# Patient Record
Sex: Female | Born: 1935 | Race: Black or African American | Hispanic: No | Marital: Single | Smoking: Never smoker
Health system: Southern US, Community
[De-identification: ages and names within clinical notes are randomized; demographics above are authoritative.]

## PROBLEM LIST (undated history)

## (undated) DIAGNOSIS — E119 Type 2 diabetes mellitus without complications: Secondary | ICD-10-CM

## (undated) DIAGNOSIS — M109 Gout, unspecified: Secondary | ICD-10-CM

## (undated) DIAGNOSIS — I10 Essential (primary) hypertension: Secondary | ICD-10-CM

## (undated) DIAGNOSIS — F329 Major depressive disorder, single episode, unspecified: Secondary | ICD-10-CM

## (undated) DIAGNOSIS — F32A Depression, unspecified: Secondary | ICD-10-CM

## (undated) DIAGNOSIS — I639 Cerebral infarction, unspecified: Secondary | ICD-10-CM

## (undated) DIAGNOSIS — I509 Heart failure, unspecified: Secondary | ICD-10-CM

## (undated) DIAGNOSIS — I213 ST elevation (STEMI) myocardial infarction of unspecified site: Secondary | ICD-10-CM

## (undated) DIAGNOSIS — Z930 Tracheostomy status: Secondary | ICD-10-CM

## (undated) DIAGNOSIS — N319 Neuromuscular dysfunction of bladder, unspecified: Secondary | ICD-10-CM

## (undated) DIAGNOSIS — D649 Anemia, unspecified: Secondary | ICD-10-CM

## (undated) DIAGNOSIS — R1310 Dysphagia, unspecified: Secondary | ICD-10-CM

## (undated) DIAGNOSIS — A0472 Enterocolitis due to Clostridium difficile, not specified as recurrent: Secondary | ICD-10-CM

## (undated) HISTORY — PX: PEG PLACEMENT: SHX5437

## (undated) HISTORY — PX: TRACHEOSTOMY: SUR1362

---

## 2014-01-17 ENCOUNTER — Emergency Department (HOSPITAL_COMMUNITY): Payer: Medicare Other

## 2014-01-17 ENCOUNTER — Inpatient Hospital Stay (HOSPITAL_COMMUNITY)
Admission: EM | Admit: 2014-01-17 | Discharge: 2014-01-25 | DRG: 870 | Disposition: A | Discharge status: Skilled Nursing Facility | Payer: Medicare Other | Attending: Internal Medicine | Admitting: Internal Medicine

## 2014-01-17 ENCOUNTER — Encounter (HOSPITAL_COMMUNITY): Payer: Self-pay | Admitting: Emergency Medicine

## 2014-01-17 DIAGNOSIS — N189 Chronic kidney disease, unspecified: Secondary | ICD-10-CM

## 2014-01-17 DIAGNOSIS — Z79899 Other long term (current) drug therapy: Secondary | ICD-10-CM

## 2014-01-17 DIAGNOSIS — J9620 Acute and chronic respiratory failure, unspecified whether with hypoxia or hypercapnia: Secondary | ICD-10-CM

## 2014-01-17 DIAGNOSIS — D649 Anemia, unspecified: Secondary | ICD-10-CM

## 2014-01-17 DIAGNOSIS — A419 Sepsis, unspecified organism: Secondary | ICD-10-CM | POA: Diagnosis present

## 2014-01-17 DIAGNOSIS — E119 Type 2 diabetes mellitus without complications: Secondary | ICD-10-CM | POA: Diagnosis not present

## 2014-01-17 DIAGNOSIS — E440 Moderate protein-calorie malnutrition: Secondary | ICD-10-CM | POA: Diagnosis not present

## 2014-01-17 DIAGNOSIS — I509 Heart failure, unspecified: Secondary | ICD-10-CM | POA: Diagnosis present

## 2014-01-17 DIAGNOSIS — Z9911 Dependence on respirator [ventilator] status: Secondary | ICD-10-CM

## 2014-01-17 DIAGNOSIS — K9420 Gastrostomy complication, unspecified: Secondary | ICD-10-CM

## 2014-01-17 DIAGNOSIS — E876 Hypokalemia: Secondary | ICD-10-CM | POA: Diagnosis not present

## 2014-01-17 DIAGNOSIS — H05019 Cellulitis of unspecified orbit: Secondary | ICD-10-CM

## 2014-01-17 DIAGNOSIS — Z794 Long term (current) use of insulin: Secondary | ICD-10-CM

## 2014-01-17 DIAGNOSIS — L89309 Pressure ulcer of unspecified buttock, unspecified stage: Secondary | ICD-10-CM | POA: Diagnosis present

## 2014-01-17 DIAGNOSIS — J9612 Chronic respiratory failure with hypercapnia: Secondary | ICD-10-CM

## 2014-01-17 DIAGNOSIS — N319 Neuromuscular dysfunction of bladder, unspecified: Secondary | ICD-10-CM | POA: Diagnosis present

## 2014-01-17 DIAGNOSIS — E41 Nutritional marasmus: Secondary | ICD-10-CM | POA: Diagnosis present

## 2014-01-17 DIAGNOSIS — Z87440 Personal history of urinary (tract) infections: Secondary | ICD-10-CM | POA: Diagnosis not present

## 2014-01-17 DIAGNOSIS — R6520 Severe sepsis without septic shock: Secondary | ICD-10-CM | POA: Diagnosis present

## 2014-01-17 DIAGNOSIS — Z515 Encounter for palliative care: Secondary | ICD-10-CM

## 2014-01-17 DIAGNOSIS — D638 Anemia in other chronic diseases classified elsewhere: Secondary | ICD-10-CM | POA: Diagnosis not present

## 2014-01-17 DIAGNOSIS — K9422 Gastrostomy infection: Secondary | ICD-10-CM | POA: Diagnosis present

## 2014-01-17 DIAGNOSIS — L8992 Pressure ulcer of unspecified site, stage 2: Secondary | ICD-10-CM | POA: Diagnosis not present

## 2014-01-17 DIAGNOSIS — Z951 Presence of aortocoronary bypass graft: Secondary | ICD-10-CM

## 2014-01-17 DIAGNOSIS — Y833 Surgical operation with formation of external stoma as the cause of abnormal reaction of the patient, or of later complication, without mention of misadventure at the time of the procedure: Secondary | ICD-10-CM | POA: Diagnosis not present

## 2014-01-17 DIAGNOSIS — IMO0002 Reserved for concepts with insufficient information to code with codable children: Secondary | ICD-10-CM

## 2014-01-17 DIAGNOSIS — K9423 Gastrostomy malfunction: Secondary | ICD-10-CM

## 2014-01-17 DIAGNOSIS — Z930 Tracheostomy status: Secondary | ICD-10-CM

## 2014-01-17 DIAGNOSIS — M1710 Unilateral primary osteoarthritis, unspecified knee: Secondary | ICD-10-CM | POA: Diagnosis present

## 2014-01-17 DIAGNOSIS — Z7982 Long term (current) use of aspirin: Secondary | ICD-10-CM | POA: Diagnosis not present

## 2014-01-17 DIAGNOSIS — L03319 Cellulitis of trunk, unspecified: Secondary | ICD-10-CM

## 2014-01-17 DIAGNOSIS — J9622 Acute and chronic respiratory failure with hypercapnia: Secondary | ICD-10-CM

## 2014-01-17 DIAGNOSIS — I252 Old myocardial infarction: Secondary | ICD-10-CM

## 2014-01-17 DIAGNOSIS — N179 Acute kidney failure, unspecified: Secondary | ICD-10-CM | POA: Diagnosis present

## 2014-01-17 DIAGNOSIS — K559 Vascular disorder of intestine, unspecified: Secondary | ICD-10-CM | POA: Diagnosis not present

## 2014-01-17 DIAGNOSIS — Z8673 Personal history of transient ischemic attack (TIA), and cerebral infarction without residual deficits: Secondary | ICD-10-CM

## 2014-01-17 DIAGNOSIS — L02219 Cutaneous abscess of trunk, unspecified: Secondary | ICD-10-CM | POA: Diagnosis present

## 2014-01-17 DIAGNOSIS — E43 Unspecified severe protein-calorie malnutrition: Secondary | ICD-10-CM

## 2014-01-17 DIAGNOSIS — J9621 Acute and chronic respiratory failure with hypoxia: Secondary | ICD-10-CM

## 2014-01-17 DIAGNOSIS — E088 Diabetes mellitus due to underlying condition with unspecified complications: Secondary | ICD-10-CM

## 2014-01-17 DIAGNOSIS — J9611 Chronic respiratory failure with hypoxia: Secondary | ICD-10-CM

## 2014-01-17 DIAGNOSIS — J9610 Chronic respiratory failure, unspecified whether with hypoxia or hypercapnia: Secondary | ICD-10-CM | POA: Diagnosis not present

## 2014-01-17 DIAGNOSIS — I129 Hypertensive chronic kidney disease with stage 1 through stage 4 chronic kidney disease, or unspecified chronic kidney disease: Secondary | ICD-10-CM | POA: Diagnosis present

## 2014-01-17 DIAGNOSIS — R0902 Hypoxemia: Secondary | ICD-10-CM

## 2014-01-17 HISTORY — DX: Depression, unspecified: F32.A

## 2014-01-17 HISTORY — DX: Dysphagia, unspecified: R13.10

## 2014-01-17 HISTORY — DX: Major depressive disorder, single episode, unspecified: F32.9

## 2014-01-17 HISTORY — DX: Heart failure, unspecified: I50.9

## 2014-01-17 HISTORY — DX: Type 2 diabetes mellitus without complications: E11.9

## 2014-01-17 HISTORY — DX: Cerebral infarction, unspecified: I63.9

## 2014-01-17 HISTORY — DX: Enterocolitis due to Clostridium difficile, not specified as recurrent: A04.72

## 2014-01-17 HISTORY — DX: Tracheostomy status: Z93.0

## 2014-01-17 HISTORY — DX: Essential (primary) hypertension: I10

## 2014-01-17 HISTORY — DX: Anemia, unspecified: D64.9

## 2014-01-17 HISTORY — DX: ST elevation (STEMI) myocardial infarction of unspecified site: I21.3

## 2014-01-17 HISTORY — DX: Gout, unspecified: M10.9

## 2014-01-17 HISTORY — DX: Neuromuscular dysfunction of bladder, unspecified: N31.9

## 2014-01-17 LAB — CK: Total CK: 32 U/L (ref 7–177)

## 2014-01-17 LAB — CBC WITH DIFFERENTIAL/PLATELET
BASOS PCT: 0 % (ref 0–1)
Basophils Absolute: 0.1 10*3/uL (ref 0.0–0.1)
Eosinophils Absolute: 0.2 10*3/uL (ref 0.0–0.7)
Eosinophils Relative: 1 % (ref 0–5)
HEMATOCRIT: 26.2 % — AB (ref 36.0–46.0)
HEMOGLOBIN: 8.6 g/dL — AB (ref 12.0–15.0)
Lymphocytes Relative: 7 % — ABNORMAL LOW (ref 12–46)
Lymphs Abs: 1.7 10*3/uL (ref 0.7–4.0)
MCH: 27.4 pg (ref 26.0–34.0)
MCHC: 32.8 g/dL (ref 30.0–36.0)
MCV: 83.4 fL (ref 78.0–100.0)
MONO ABS: 1 10*3/uL (ref 0.1–1.0)
Monocytes Relative: 4 % (ref 3–12)
Neutro Abs: 21.4 10*3/uL — ABNORMAL HIGH (ref 1.7–7.7)
Neutrophils Relative %: 88 % — ABNORMAL HIGH (ref 43–77)
Platelets: 346 10*3/uL (ref 150–400)
RBC: 3.14 MIL/uL — ABNORMAL LOW (ref 3.87–5.11)
RDW: 16.9 % — ABNORMAL HIGH (ref 11.5–15.5)
WBC: 24.4 10*3/uL — ABNORMAL HIGH (ref 4.0–10.5)

## 2014-01-17 LAB — I-STAT CHEM 8, ED
BUN: 63 mg/dL — ABNORMAL HIGH (ref 6–23)
CALCIUM ION: 1.05 mmol/L — AB (ref 1.13–1.30)
CREATININE: 2.6 mg/dL — AB (ref 0.50–1.10)
Chloride: 98 mEq/L (ref 96–112)
Glucose, Bld: 146 mg/dL — ABNORMAL HIGH (ref 70–99)
HEMATOCRIT: 27 % — AB (ref 36.0–46.0)
HEMOGLOBIN: 9.2 g/dL — AB (ref 12.0–15.0)
Potassium: 2.9 mEq/L — CL (ref 3.7–5.3)
Sodium: 134 mEq/L — ABNORMAL LOW (ref 137–147)
TCO2: 26 mmol/L (ref 0–100)

## 2014-01-17 LAB — MAGNESIUM: Magnesium: 2.5 mg/dL (ref 1.5–2.5)

## 2014-01-17 LAB — TROPONIN I

## 2014-01-17 LAB — URINALYSIS, ROUTINE W REFLEX MICROSCOPIC
BILIRUBIN URINE: NEGATIVE
GLUCOSE, UA: NEGATIVE mg/dL
KETONES UR: NEGATIVE mg/dL
Nitrite: NEGATIVE
PH: 5 (ref 5.0–8.0)
Protein, ur: 100 mg/dL — AB
Specific Gravity, Urine: 1.018 (ref 1.005–1.030)
Urobilinogen, UA: 0.2 mg/dL (ref 0.0–1.0)

## 2014-01-17 LAB — I-STAT CG4 LACTIC ACID, ED: Lactic Acid, Venous: 1.39 mmol/L (ref 0.5–2.2)

## 2014-01-17 LAB — URINE MICROSCOPIC-ADD ON

## 2014-01-17 LAB — CBG MONITORING, ED: Glucose-Capillary: 151 mg/dL — ABNORMAL HIGH (ref 70–99)

## 2014-01-17 MED ORDER — INSULIN ASPART 100 UNIT/ML ~~LOC~~ SOLN
0.0000 [IU] | SUBCUTANEOUS | Status: DC
  Administered 2014-01-18: 1 [IU] via SUBCUTANEOUS
  Administered 2014-01-18: 2 [IU] via SUBCUTANEOUS
  Administered 2014-01-18 (×2): 1 [IU] via SUBCUTANEOUS
  Administered 2014-01-19 (×2): 3 [IU] via SUBCUTANEOUS
  Administered 2014-01-19 (×2): 2 [IU] via SUBCUTANEOUS
  Administered 2014-01-19 (×2): 3 [IU] via SUBCUTANEOUS
  Administered 2014-01-20 (×4): 1 [IU] via SUBCUTANEOUS
  Administered 2014-01-20 – 2014-01-21 (×3): 2 [IU] via SUBCUTANEOUS
  Administered 2014-01-21: 3 [IU] via SUBCUTANEOUS
  Administered 2014-01-21 – 2014-01-22 (×6): 2 [IU] via SUBCUTANEOUS

## 2014-01-17 MED ORDER — FUROSEMIDE 10 MG/ML IJ SOLN
40.0000 mg | Freq: Every day | INTRAMUSCULAR | Status: DC
  Administered 2014-01-18 – 2014-01-25 (×8): 40 mg via INTRAVENOUS
  Filled 2014-01-17 (×9): qty 4

## 2014-01-17 MED ORDER — SODIUM CHLORIDE 0.9 % IV SOLN
250.0000 mg | Freq: Two times a day (BID) | INTRAVENOUS | Status: DC
  Administered 2014-01-17 – 2014-01-24 (×14): 250 mg via INTRAVENOUS
  Filled 2014-01-17 (×15): qty 250

## 2014-01-17 MED ORDER — HYDRALAZINE HCL 25 MG PO TABS: 25.0000 mg | ORAL_TABLET | Freq: Two times a day (BID) | ORAL | Status: DC

## 2014-01-17 MED ORDER — POTASSIUM CHLORIDE 10 MEQ/100ML IV SOLN: 10.0000 meq | INTRAVENOUS | Status: AC

## 2014-01-17 MED ORDER — DIVALPROEX SODIUM 125 MG PO CPSP: 125.0000 mg | ORAL_CAPSULE | Freq: Two times a day (BID) | ORAL | Status: DC

## 2014-01-17 MED ORDER — METOPROLOL TARTRATE 1 MG/ML IV SOLN
5.0000 mg | Freq: Four times a day (QID) | INTRAVENOUS | Status: DC
  Administered 2014-01-18 – 2014-01-25 (×29): 5 mg via INTRAVENOUS
  Filled 2014-01-17 (×34): qty 5

## 2014-01-17 MED ORDER — DEXTROSE 5 % IV SOLN: 1.0000 g | INTRAVENOUS | Status: DC

## 2014-01-17 MED ORDER — POTASSIUM CHLORIDE 10 MEQ/100ML IV SOLN
10.0000 meq | Freq: Once | INTRAVENOUS | Status: AC
  Administered 2014-01-17: 10 meq via INTRAVENOUS
  Filled 2014-01-17: qty 100

## 2014-01-17 MED ORDER — ALLOPURINOL 100 MG PO TABS: 100.0000 mg | ORAL_TABLET | Freq: Every day | ORAL | Status: DC

## 2014-01-17 MED ORDER — FERROUS SULFATE 300 (60 FE) MG/5ML PO SYRP
300.0000 mg | ORAL_SOLUTION | Freq: Two times a day (BID) | ORAL | Status: DC
Start: 2014-01-18 — End: 2014-01-17

## 2014-01-17 MED ORDER — CHLORHEXIDINE GLUCONATE 0.12 % MT SOLN
15.0000 mL | Freq: Two times a day (BID) | OROMUCOSAL | Status: DC
  Administered 2014-01-17 – 2014-01-25 (×16): 15 mL via OROMUCOSAL
  Filled 2014-01-17 (×17): qty 15

## 2014-01-17 MED ORDER — GLUCAGON HCL (RDNA) 1 MG IJ SOLR: 1.0000 mg | Freq: Once | INTRAMUSCULAR | Status: AC | PRN

## 2014-01-17 MED ORDER — ASPIRIN 81 MG PO CHEW: 81.0000 mg | CHEWABLE_TABLET | Freq: Every day | ORAL | Status: DC

## 2014-01-17 MED ORDER — DEXTROSE 5 % IV SOLN: 2.0000 g | Freq: Once | INTRAVENOUS | Status: DC

## 2014-01-17 MED ORDER — FLUCONAZOLE IN SODIUM CHLORIDE 200-0.9 MG/100ML-% IV SOLN
200.0000 mg | INTRAVENOUS | Status: DC
  Administered 2014-01-18: 200 mg via INTRAVENOUS
  Filled 2014-01-17: qty 100

## 2014-01-17 MED ORDER — INSULIN DETEMIR 100 UNIT/ML ~~LOC~~ SOLN: 12.0000 [IU] | Freq: Two times a day (BID) | SUBCUTANEOUS | Status: DC

## 2014-01-17 MED ORDER — METOPROLOL TARTRATE 25 MG PO TABS: 50.0000 mg | ORAL_TABLET | Freq: Two times a day (BID) | ORAL | Status: DC

## 2014-01-17 MED ORDER — SODIUM BICARBONATE 650 MG PO TABS: 1300.0000 mg | ORAL_TABLET | Freq: Two times a day (BID) | ORAL | Status: DC

## 2014-01-17 MED ORDER — DEXTROSE 50 % IV SOLN: 50.0000 mL | Freq: Once | INTRAVENOUS | Status: AC | PRN

## 2014-01-17 MED ORDER — FUROSEMIDE 20 MG PO TABS: 80.0000 mg | ORAL_TABLET | Freq: Two times a day (BID) | ORAL | Status: DC

## 2014-01-17 MED ORDER — IMIPENEM-CILASTATIN 500 MG IV SOLR: 500.0000 mg | Freq: Three times a day (TID) | INTRAVENOUS | Status: DC

## 2014-01-17 MED ORDER — DEXTROSE 50 % IV SOLN: 25.0000 mL | Freq: Once | INTRAVENOUS | Status: AC | PRN

## 2014-01-17 MED ORDER — FUROSEMIDE 10 MG/ML IJ SOLN
20.0000 mg | Freq: Once | INTRAMUSCULAR | Status: AC
  Administered 2014-01-18: 20 mg via INTRAVENOUS
  Filled 2014-01-17: qty 2

## 2014-01-17 MED ORDER — DEXTROSE-NACL 5-0.45 % IV SOLN
INTRAVENOUS | Status: AC
  Administered 2014-01-18: 03:00:00 via INTRAVENOUS

## 2014-01-17 NOTE — ED Notes (Signed)
CBG 151. 

## 2014-01-17 NOTE — Consult Note (Signed)
Reason for Consult:  PEG tube dysfunction Referring Physician: Dr. Lyda Perone  Tricia Reed is an 80 y.o. female.  HPI: 80 year old AA female who was admitted from [facility] after PEG tube became non-functional.  This is the patient's first admission into the [facility] system.  Reportedly, in May 2015, she was at [facility] and suffered a STEMI.  She underwent emergent CABG and had prolonged intubation requiring tracheostomy.  She was then transferred to [facility] in [place].  While at [facility] for one month, she developed pressure ulcers, acute renal failure, C. Diff colitis, Klebsiella tracheitis.  She was transferred to [facility] 12/01/13. The PEG was placed by Dr. Hardin Negus on 12/28/13.  Apparently, she has developed a lot of drainage and cellulitis around the feeding tube. There have been progress notes in their records from at least 8/24 regarding the feeding tube dysfunction. There are no further notes from Dr. Logan Bores.  They have been treating her with D10 IV for the last week.     She was then transferred to [facility] because of the feeding tube.  She is still a full code  Past Medical History  Diagnosis Date  . Diabetes mellitus without complication   . Tracheostomy dependence   . Dysphagia   . Clostridium difficile diarrhea   . Gout   . Hypertension   . CVA (cerebral infarction)   . CHF (congestive heart failure)   . Anemia   . Depressed   . Neurogenic bladder   . Stroke   . STEMI (ST elevation myocardial infarction)     Past Surgical History  Procedure Laterality Date  . Peg placement    . Tracheostomy      History reviewed. No pertinent family history.  Social History:  reports that she has never smoked. She does not have any smokeless tobacco history on file. She reports that she does not drink alcohol or use illicit drugs.  Allergies: No Known Allergies  Medications:  Prior to Admission medications   Medication Sig Start Date End Date Taking?  Authorizing Provider  allopurinol (ZYLOPRIM) 100 MG tablet Take 100 mg by mouth daily.   Yes Historical Provider, MD  aspirin 81 MG chewable tablet Chew 81 mg by mouth daily.   Yes Historical Provider, MD  chlorhexidine (PERIDEX) 0.12 % solution Use as directed 15 mLs in the mouth or throat 2 (two) times daily.   Yes Historical Provider, MD  DAPTOMYCIN IV Inject 500 mg into the vein daily. Started 01/10/14, for 14 days ending 01/24/14   Yes Historical Provider, MD  divalproex (DEPAKOTE SPRINKLE) 125 MG capsule Take 125 mg by mouth 2 (two) times daily.   Yes Historical Provider, MD  enoxaparin (LOVENOX) 30 MG/0.3ML injection Inject 30 mg into the skin daily. Discontinued 01/12/14 12/03/13  Yes Historical Provider, MD  ferrous sulfate 300 (60 FE) MG/5ML syrup Take 300 mg by mouth 2 (two) times daily with a meal.   Yes Historical Provider, MD  fluconazole (DIFLUCAN) 200-0.9 MG/100ML-% IVPB Inject 200 mg into the vein daily. For 14 days 01/10/14  Yes Historical Provider, MD  furosemide (LASIX) 80 MG tablet Take 80 mg by mouth 2 (two) times daily.   Yes Historical Provider, MD  glucagon (GLUCAGEN HYPOKIT) 1 MG SOLR injection Inject 1 mg into the vein once as needed for low blood sugar.   Yes Historical Provider, MD  hydrALAZINE (APRESOLINE) 25 MG tablet Take 25 mg by mouth 2 (two) times daily.   Yes Historical  Provider, MD  insulin detemir (LEVEMIR) 100 UNIT/ML injection Inject 12 Units into the skin 2 (two) times daily.   Yes Historical Provider, MD  insulin regular (NOVOLIN R,HUMULIN R) 100 units/mL injection Inject 0-5 Units into the skin 3 (three) times daily before meals.   Yes Historical Provider, MD  meropenem (MERREM) 500 MG injection Inject 500 mg into the muscle every 8 (eight) hours. Started 01/12/14, for 14 days ending 01/27/14   Yes Historical Provider, MD  metoprolol (LOPRESSOR) 50 MG tablet Take 50 mg by mouth 2 (two) times daily.   Yes Historical Provider, MD  potassium chloride 40 MEQ/15ML (20%)  LIQD Take 40 mEq by mouth as needed (for potassium 2.8).   Yes Historical Provider, MD  sodium bicarbonate 650 MG tablet Take 1,300 mg by mouth 2 (two) times daily.   Yes Historical Provider, MD  vancomycin 500 mg in sodium chloride 0.9 % 100 mL Inject 500 mg into the vein every 12 (twelve) hours.   Yes Historical Provider, MD  CALCIUM GLUCONATE IV Inject 1 application into the vein as needed (for calcium 6.5).    Historical Provider, MD  linezolid (ZYVOX) 2 MG/ML IVPB Inject 600 mg into the vein every 12 (twelve) hours.    Historical Provider, MD     Results for orders placed during the hospital encounter of 01/17/14 (from the past 48 hour(s))  CBG MONITORING, ED     Status: Abnormal   Collection Time    01/17/14  4:27 PM      Result Value Ref Range   Glucose-Capillary 151 (*) 70 - 99 mg/dL   Comment 1 Documented in Chart     Comment 2 Notify RN    CBC WITH DIFFERENTIAL     Status: Abnormal   Collection Time    01/17/14  6:27 PM      Result Value Ref Range   WBC 24.4 (*) 4.0 - 10.5 K/uL   RBC 3.14 (*) 3.87 - 5.11 MIL/uL   Hemoglobin 8.6 (*) 12.0 - 15.0 g/dL   HCT 96.0 (*) 45.4 - 09.8 %   MCV 83.4  78.0 - 100.0 fL   MCH 27.4  26.0 - 34.0 pg   MCHC 32.8  30.0 - 36.0 g/dL   RDW 11.9 (*) 14.7 - 82.9 %   Platelets 346  150 - 400 K/uL   Neutrophils Relative % 88 (*) 43 - 77 %   Neutro Abs 21.4 (*) 1.7 - 7.7 K/uL   Lymphocytes Relative 7 (*) 12 - 46 %   Lymphs Abs 1.7  0.7 - 4.0 K/uL   Monocytes Relative 4  3 - 12 %   Monocytes Absolute 1.0  0.1 - 1.0 K/uL   Eosinophils Relative 1  0 - 5 %   Eosinophils Absolute 0.2  0.0 - 0.7 K/uL   Basophils Relative 0  0 - 1 %   Basophils Absolute 0.1  0.0 - 0.1 K/uL  MAGNESIUM     Status: None   Collection Time    01/17/14  6:27 PM      Result Value Ref Range   Magnesium 2.5  1.5 - 2.5 mg/dL  CK     Status: None   Collection Time    01/17/14  6:27 PM      Result Value Ref Range   Total CK 32  7 - 177 U/L  I-STAT CHEM 8, ED     Status:  Abnormal   Collection Time    01/17/14  6:36 PM      Result Value Ref Range   Sodium 134 (*) 137 - 147 mEq/L   Potassium 2.9 (*) 3.7 - 5.3 mEq/L   Chloride 98  96 - 112 mEq/L   BUN 63 (*) 6 - 23 mg/dL   Creatinine, Ser 1.61 (*) 0.50 - 1.10 mg/dL   Glucose, Bld 096 (*) 70 - 99 mg/dL   Calcium, Ion 0.45 (*) 1.13 - 1.30 mmol/L   TCO2 26  0 - 100 mmol/L   Hemoglobin 9.2 (*) 12.0 - 15.0 g/dL   HCT 40.9 (*) 81.1 - 91.4 %   Comment NOTIFIED PHYSICIAN    I-STAT CG4 LACTIC ACID, ED     Status: None   Collection Time    01/17/14  6:37 PM      Result Value Ref Range   Lactic Acid, Venous 1.39  0.5 - 2.2 mmol/L  URINALYSIS, ROUTINE W REFLEX MICROSCOPIC     Status: Abnormal   Collection Time    01/17/14  8:30 PM      Result Value Ref Range   Color, Urine AMBER (*) YELLOW   Comment: BIOCHEMICALS MAY BE AFFECTED BY COLOR   APPearance TURBID (*) CLEAR   Specific Gravity, Urine 1.018  1.005 - 1.030   pH 5.0  5.0 - 8.0   Glucose, UA NEGATIVE  NEGATIVE mg/dL   Hgb urine dipstick MODERATE (*) NEGATIVE   Bilirubin Urine NEGATIVE  NEGATIVE   Ketones, ur NEGATIVE  NEGATIVE mg/dL   Protein, ur 782 (*) NEGATIVE mg/dL   Urobilinogen, UA 0.2  0.0 - 1.0 mg/dL   Nitrite NEGATIVE  NEGATIVE   Leukocytes, UA LARGE (*) NEGATIVE  URINE MICROSCOPIC-ADD ON     Status: Abnormal   Collection Time    01/17/14  8:30 PM      Result Value Ref Range   Squamous Epithelial / LPF MANY (*) RARE   WBC, UA 21-50  <3 WBC/hpf   RBC / HPF 7-10  <3 RBC/hpf   Bacteria, UA MANY (*) RARE   Urine-Other AMORPHOUS URATES/PHOSPHATES      Dg Chest Portable 1 View  01/17/2014   CLINICAL DATA:  Central line placement  EXAM: PORTABLE CHEST - 1 VIEW  COMPARISON:  None.  FINDINGS: Right internal jugular central line tip identified projecting over superior vena cava at the level of the azygos arch, approximately 1.5 cm above the cavoatrial junction. No pneumothorax. Tracheostomy tube projects over the tracheal air column.  Heart size  normal. There is extensive bilateral central hazy opacity. There is calcification of the aortic arch.  IMPRESSION: Central line as described. Extensive bilateral airspace disease possibly representing pulmonary edema.   Electronically Signed   By: Esperanza Heir M.D.   On: 01/17/2014 19:12    ROS Unable to obtain - patient confused Blood pressure 112/67, pulse 101, temperature 98.3 F (36.8 C), temperature source Oral, resp. rate 12, SpO2 100.00%. Physical Exam Elderly female - awake, indicates no pain Abd - obese, no obvious tenderness to palpation Mild erythema - significant excoriation around gastrostomy site.  There is about a 3 cm area of skin and subcutaneous tissue breakdown around the tube.  The tube is very loose with the flange about 4 cm above the abdominal wall.  Gastric contents are continually leaking around this tube.  I tightened this flange and the drainage decreased slightly Assessment/Plan: Poorly functioning PEG tube - placed by Dr. Hardin Negus at [facility]  Confusion Renal failure Chronic UTI -  Recommendations:  Palliative Care consult Keep NPO - TNA for nutritional support Will obtain gastrografin tube study tomorrow to see if the tube is in the lumen of the stomach.  This tube will likely need to be replaced and some time may be needed for the subcutaneous tissue to heal around the tube.    Poor overall prognosis.    Tricia Reed K. 01/17/2014, 10:37 PM

## 2014-01-17 NOTE — H&P (Signed)
[facility] History and Physical  Tricia Reed ZOX:[id number] DOB: Jan 27, 1927 DOA: 01/17/2014  Referring physician: EDP PCP: No primary provider on file.   Chief Complaint: Peg tube failure   HPI: Tricia Reed is a 80 y.o. female with complex recent PMH.  Essentially she recently had a STEMI at [facility] treated with CABG after which she was vent dependent on a trach.  She was discharged with tracheostomy to [facility] on 6/11 to continue vent weaning.  Ultimately they were only able to wean her to trach collar for 48 to 72 hours after which she developed hypercapnic respiratory failure and went back on vent.  While at [facility] she also had an acute lacunar infarct of her brain.  She was transferred to [facility] where she had multiple problems including a bout of C.Diff colitis, Funguria, Klebsellia tracheitis, K.Oxytocia UTI, and finally peg-tube dysfunction with leakage around the peg tube and development of cellulitis in that area (while on Imipenem to treat the K.Oxytocia UTI, Fluconazole to treat the funguria, and daptomycin (not sure still exactly what was being treated with this).  Review of Systems: Systems reviewed.  As above, otherwise negative  Past Medical History  Diagnosis Date  . Diabetes mellitus without complication   . Tracheostomy dependence   . Dysphagia   . Clostridium difficile diarrhea   . Gout   . Hypertension   . CVA (cerebral infarction)   . CHF (congestive heart failure)   . Anemia   . Depressed   . Neurogenic bladder   . Stroke   . STEMI (ST elevation myocardial infarction)    Past Surgical History  Procedure Laterality Date  . Peg placement    . Tracheostomy     Social History:  reports that she has never smoked. She does not have any smokeless tobacco history on file. She reports that she does not drink alcohol or use illicit drugs.  No Known Allergies  History reviewed. No pertinent family history.   Prior to Admission  medications   Medication Sig Start Date End Date Taking? Authorizing Provider  allopurinol (ZYLOPRIM) 100 MG tablet Take 100 mg by mouth daily.   Yes Historical Provider, MD  aspirin 81 MG chewable tablet Chew 81 mg by mouth daily.   Yes Historical Provider, MD  chlorhexidine (PERIDEX) 0.12 % solution Use as directed 15 mLs in the mouth or throat 2 (two) times daily.   Yes Historical Provider, MD  DAPTOMYCIN IV Inject 500 mg into the vein daily. Started 01/10/14, for 14 days ending 01/24/14   Yes Historical Provider, MD  divalproex (DEPAKOTE SPRINKLE) 125 MG capsule Take 125 mg by mouth 2 (two) times daily.   Yes Historical Provider, MD  enoxaparin (LOVENOX) 30 MG/0.3ML injection Inject 30 mg into the skin daily. Discontinued 01/12/14 12/03/13  Yes Historical Provider, MD  ferrous sulfate 300 (60 FE) MG/5ML syrup Take 300 mg by mouth 2 (two) times daily with a meal.   Yes Historical Provider, MD  fluconazole (DIFLUCAN) 200-0.9 MG/100ML-% IVPB Inject 200 mg into the vein daily. For 14 days 01/10/14  Yes Historical Provider, MD  furosemide (LASIX) 80 MG tablet Take 80 mg by mouth 2 (two) times daily.   Yes Historical Provider, MD  glucagon (GLUCAGEN HYPOKIT) 1 MG SOLR injection Inject 1 mg into the vein once as needed for low blood sugar.   Yes Historical Provider, MD  hydrALAZINE (APRESOLINE) 25 MG tablet Take 25 mg by mouth 2 (two) times daily.   Yes Historical  Provider, MD  insulin detemir (LEVEMIR) 100 UNIT/ML injection Inject 12 Units into the skin 2 (two) times daily.   Yes Historical Provider, MD  insulin regular (NOVOLIN R,HUMULIN R) 100 units/mL injection Inject 0-5 Units into the skin 3 (three) times daily before meals.   Yes Historical Provider, MD  meropenem (MERREM) 500 MG injection Inject 500 mg into the muscle every 8 (eight) hours. Started 01/12/14, for 14 days ending 01/27/14   Yes Historical Provider, MD  metoprolol (LOPRESSOR) 50 MG tablet Take 50 mg by mouth 2 (two) times daily.   Yes  Historical Provider, MD  potassium chloride 40 MEQ/15ML (20%) LIQD Take 40 mEq by mouth as needed (for potassium 2.8).   Yes Historical Provider, MD  sodium bicarbonate 650 MG tablet Take 1,300 mg by mouth 2 (two) times daily.   Yes Historical Provider, MD  vancomycin 500 mg in sodium chloride 0.9 % 100 mL Inject 500 mg into the vein every 12 (twelve) hours.   Yes Historical Provider, MD  CALCIUM GLUCONATE IV Inject 1 application into the vein as needed (for calcium 6.5).    Historical Provider, MD  linezolid (ZYVOX) 2 MG/ML IVPB Inject 600 mg into the vein every 12 (twelve) hours.    Historical Provider, MD   Physical Exam: Filed Vitals:   01/17/14 1957  BP:   Pulse: 96  Temp:   Resp: 30    BP 111/72  Pulse 96  Temp(Src) 98.3 F (36.8 C) (Oral)  Resp 30  SpO2 100%  General Appearance:    Alert, oriented while I am in the room to person, place, not oriented to why she is here and not able to give much history, no distress, appears stated age  Head:    Normocephalic, atraumatic  Eyes:    PERRL, EOMI, sclera non-icteric        Nose:   Nares without drainage or epistaxis. Mucosa, turbinates normal  Throat:   Moist mucous membranes. Oropharynx without erythema or exudate.  Neck:   Supple. No carotid bruits.  No thyromegaly.  No lymphadenopathy.   Back:     No CVA tenderness, no spinal tenderness  Lungs:     Coarse breath sounds bilaterally.  Chest wall:    No tenderness to palpitation  Heart:    Regular rate and rhythm without murmurs, gallops, rubs  Abdomen:     Soft, non-tender, nondistended, large stoma around peg tube site with copious secretions, surrounding cellulitis.  Genitalia:    deferred  Rectal:    deferred  Extremities:   No clubbing, cyanosis or edema.  Pulses:   2+ and symmetric all extremities  Skin:   Skin color, texture, turgor normal, no rashes or lesions  Lymph nodes:   Cervical, supraclavicular, and axillary nodes normal  Neurologic:   CNII-XII intact. Normal  strength, sensation and reflexes      throughout    Labs on Admission:  Basic Metabolic Panel:  Recent Labs Lab 01/17/14 1827 01/17/14 1836  NA  --  134*  K  --  2.9*  CL  --  98  GLUCOSE  --  146*  BUN  --  63*  CREATININE  --  2.60*  MG 2.5  --    Liver Function Tests: No results found for this basename: AST, ALT, ALKPHOS, BILITOT, PROT, ALBUMIN,  in the last 168 hours No results found for this basename: LIPASE, AMYLASE,  in the last 168 hours No results found for this basename: AMMONIA,  in  the last 168 hours CBC:  Recent Labs Lab 01/17/14 1827 01/17/14 1836  WBC 24.4*  --   NEUTROABS 21.4*  --   HGB 8.6* 9.2*  HCT 26.2* 27.0*  MCV 83.4  --   PLT 346  --    Cardiac Enzymes: No results found for this basename: CKTOTAL, CKMB, CKMBINDEX, TROPONINI,  in the last 168 hours  BNP (last 3 results) No results found for this basename: PROBNP,  in the last 8760 hours CBG:  Recent Labs Lab 01/17/14 1627  GLUCAP 151*    Radiological Exams on Admission: Dg Chest Portable 1 View  01/17/2014   CLINICAL DATA:  Central line placement  EXAM: PORTABLE CHEST - 1 VIEW  COMPARISON:  None.  FINDINGS: Right internal jugular central line tip identified projecting over superior vena cava at the level of the azygos arch, approximately 1.5 cm above the cavoatrial junction. No pneumothorax. Tracheostomy tube projects over the tracheal air column.  Heart size normal. There is extensive bilateral central hazy opacity. There is calcification of the aortic arch.  IMPRESSION: Central line as described. Extensive bilateral airspace disease possibly representing pulmonary edema.   Electronically Signed   By: Esperanza Heir M.D.   On: 01/17/2014 19:12    EKG: Independently reviewed.  Assessment/Plan Principal Problem:   PEG tube malfunction Active Problems:   Chronic respiratory failure   Tracheostomy in place   Ventilator dependent   Sepsis   CKD (chronic kidney disease)    Hypokalemia   Anemia  A/P by system:  Neurologic: Grossly intact neurologically, patient is able to answer questions, although she dosent really know why she is here. Despite recent Lacunar CVA will hold her ASA 81 and lovenox she takes at the LTAC due to the presence of anemia and heme-positive diffuse colitis.  SCDs for DVT ppx instead.  Heme: anemia due to chronic disease, malnutrition, and heme-positive stool.  Stopping blood thinners, monitor daily CBCs.  GI / nutrition / endocrine: Peg tube malfunction, and heme-positive diffuse colitis: it appears they have been treating the patient with D10 only for several days per notes.  General surgery has been consulted to manage peg tube dysfunction.  Patient has been made NPO for this time and put on D5 1/2NS overnight only, with formal nutrition consult in the AM, if she is unable to take PO via another source (KO tube etc) per GI then she would of course need TPN.  Have held her levemir due to recent hypoglycemic episodes and NPO status and instead just putting her on low dose SSI for now.  Regarding her heme-positive colitis, C.Diff was negative at [facility], will re-check this, impression at [facility] was probable ischemic colitis.  Replace potassium IV and recheck in AM.  ID: Cellulitis around peg tube site, K.Oxytoca UTI, Funguria, are her active infections as far as I can tell (also has history of Klebsiella tracheitis, and C.Diff during her LTAC stay).  She comes to [place] on Daptomycin (unclear exactly what for), Imipenem (for K. Oxytoca in urine which was resistant to just about everything but this), and Fluconazole (for funguria).  WBC of 24.4k today, mild tachycardia at 102, Have spoke directly with PharmD here in the ED and will go ahead and leave her on these meds for tonight until ID can come in and do a formal consult in AM to make recommendations for which (if any) of these should be continued.  Pulmonary: Patient is vent dependent, with recent  worsening of pulmonary status per  a pulm note by Dr. Welton Flakes on August 25th from [facility].  PCCM is managing the patients vent, CXR shows diffuse bilateral opacities suspicious of pulmonary edema.  Will give patient lasix  IV daily for now as she is not taking her  PO BID from the nursing home.  Renal: Patient appears to have developed CKD with labs earlier this month showing creatinine in the low 2s, and BUNs in the 60s.  Current renal function today likely represents her baseline now.  Will gently diurese with lasix IV for now.  Monitor intake and output and daily BMP.  Cardiac: s/p recent CABG, no acute issues at this time, will convert beta blocker to IV (already spoke with PharmD about this), start at a lighter dose of  IV q6h then titrate up as needed to the iv conversion dose of  IV q6h.  PCCM formally consulted for vent management. ID Dr. Ninetta Lights formally consulted to see patient tomorrow for the peg tube cellulitis and numerous anti-infectives she is on. General surgery Dr. Corliss Skains has been consulted to see patient tomorrow for peg tube malfunction.  Code Status: Patient remains full code  Family Communication: No family in room Disposition Plan: Admit to SDU   Time spent: 130 mins, extended length of service due to numerous critical care issues as outlined above.  GARDNER, JARED M. [facility] Pager [phone]  If 7AM-7PM, please contact the day team taking care of the patient [email or web address] Password [facility] 01/17/2014, 9:42 PM

## 2014-01-17 NOTE — Consult Note (Signed)
PULMONARY / CRITICAL CARE MEDICINE   Name: Tricia Reed MRN: [id number] DOB: 02/27/1927    ADMISSION DATE:  01/17/2014 CONSULTATION DATE:  01/17/2014   REFERRING MD :  [facility] hospitalist  CHIEF COMPLAINT:  Chronic respiratory failure  INITIAL PRESENTATION: see HPI   SIGNIFICANT EVENTS: 01/17/2014 - admit    HISTORY OF PRESENT ILLNESS:  80 year old AA female. Admitted from [facility] after PEG fell off. PCCM consulted for vent mgmt - chronic respiratory failure. Hx as best obtained. She was ? At [facility] and underwent emergent CABG for STEMI and s/p IABP and failed extubation  x2 with s/p tracheostomy 10/11/13 and ultimately transferred to [facility] [address] on 10/28/13. She spent 1 month at [facility] where course was complicated by continued vent dependence, C diff Diarrhea, Klebsiella tracheitis, PRessure unclers, Acute renal failure (BUN 75, creat 1.5mg % when well won 12/01/13) and ongoing encephalopathy Rx with seroquel. [facility]red to [facility] 12/01/13. Unclear details at [facility] hospital but it appears as of 01/12/14 there were issues with funguria with ID consult and she was still dealing with diffuse colitis confirmed on CT with c diff negative but occult stool blood positive and concern for ischemic colitis. AT some point developed PEG tube dysfunction a/ PEG fell off and [facility] PCCM now consulting for vent mgmt. No vent issues recorded per transfer notes but patient in ER noted to be on 50% fio2 (but notes indicate she is normally on 28%)  PAST MEDICAL HISTORY :  Past Medical History  Diagnosis Date  . Diabetes mellitus without complication   . Tracheostomy dependence   . Dysphagia   . Clostridium difficile diarrhea   . Gout   . Hypertension   . CVA (cerebral infarction)   . CHF (congestive heart failure)   . Anemia   . Depressed   . Neurogenic bladder   . Stroke   . STEMI (ST elevation myocardial infarction)    Past Surgical History  Procedure Laterality Date  . Peg  placement    . Tracheostomy     Prior to Admission medications   Medication Sig Start Date End Date Taking? Authorizing Provider  allopurinol (ZYLOPRIM) 100 MG tablet Take 100 mg by mouth daily.   Yes Historical Provider, MD  aspirin 81 MG chewable tablet Chew 81 mg by mouth daily.   Yes Historical Provider, MD  chlorhexidine (PERIDEX) 0.12 % solution Use as directed 15 mLs in the mouth or throat 2 (two) times daily.   Yes Historical Provider, MD  DAPTOMYCIN IV Inject 500 mg into the vein daily. Started 01/10/14, for 14 days ending 01/24/14   Yes Historical Provider, MD  divalproex (DEPAKOTE SPRINKLE) 125 MG capsule Take 125 mg by mouth 2 (two) times daily.   Yes Historical Provider, MD  enoxaparin (LOVENOX) 30 MG/0.3ML injection Inject 30 mg into the skin daily. Discontinued 01/12/14 12/03/13  Yes Historical Provider, MD  ferrous sulfate 300 (60 FE) MG/5ML syrup Take 300 mg by mouth 2 (two) times daily with a meal.   Yes Historical Provider, MD  fluconazole (DIFLUCAN) 200-0.9 MG/100ML-% IVPB Inject 200 mg into the vein daily. For 14 days 01/10/14  Yes Historical Provider, MD  furosemide (LASIX) 80 MG tablet Take 80 mg by mouth 2 (two) times daily.   Yes Historical Provider, MD  glucagon (GLUCAGEN HYPOKIT) 1 MG SOLR injection Inject 1 mg into the vein once as needed for low blood sugar.   Yes Historical Provider, MD  hydrALAZINE (APRESOLINE) 25 MG tablet Take 25  mg by mouth 2 (two) times daily.   Yes Historical Provider, MD  insulin detemir (LEVEMIR) 100 UNIT/ML injection Inject 12 Units into the skin 2 (two) times daily.   Yes Historical Provider, MD  insulin regular (NOVOLIN R,HUMULIN R) 100 units/mL injection Inject 0-5 Units into the skin 3 (three) times daily before meals.   Yes Historical Provider, MD  meropenem (MERREM) 500 MG injection Inject 500 mg into the muscle every 8 (eight) hours. Started 01/12/14, for 14 days ending 01/27/14   Yes Historical Provider, MD  metoprolol (LOPRESSOR) 50 MG  tablet Take 50 mg by mouth 2 (two) times daily.   Yes Historical Provider, MD  potassium chloride 40 MEQ/15ML (20%) LIQD Take 40 mEq by mouth as needed (for potassium 2.8).   Yes Historical Provider, MD  sodium bicarbonate 650 MG tablet Take 1,300 mg by mouth 2 (two) times daily.   Yes Historical Provider, MD  vancomycin 500 mg in sodium chloride 0.9 % 100 mL Inject 500 mg into the vein every 12 (twelve) hours.   Yes Historical Provider, MD  CALCIUM GLUCONATE IV Inject 1 application into the vein as needed (for calcium 6.5).    Historical Provider, MD  linezolid (ZYVOX) 2 MG/ML IVPB Inject 600 mg into the vein every 12 (twelve) hours.    Historical Provider, MD   No Known Allergies  FAMILY HISTORY:  History reviewed. No pertinent family history. SOCIAL HISTORY:  reports that she has never smoked. She does not have any smokeless tobacco history on file. She reports that she does not drink alcohol or use illicit drugs.  REVIEW OF SYSTEMS:  unelicitable    VITAL SIGNS: Temp:  [98.3 F (36.8 C)] 98.3 F (36.8 C) (08/31 1621) Pulse Rate:  [95-101] 96 (08/31 1957) Resp:  [0-30] 30 (08/31 1957) BP: (94-113)/(46-72) 111/72 mmHg (08/31 1938) SpO2:  [99 %-100 %] 100 % (08/31 1957) FiO2 (%):  [50 %] 50 % (08/31 1957) HEMODYNAMICS:   VENTILATOR SETTINGS: Vent Mode:  [-] PRVC FiO2 (%):  [50 %] 50 % Set Rate:  [24 bmp] 24 bmp Vt Set:  [450 mL] 450 mL PEEP:  [5 cmH20] 5 cmH20 Plateau Pressure:  [27 cmH20] 27 cmH20 INTAKE / OUTPUT: No intake or output data in the 24 hours ending 01/17/14 2140  PHYSICAL EXAMINATION: General:  Deconditioned, chronic critically ill looking female Neuro:  Alert, mouthing, not sure she understands but denies complaints HEENT:  S/p trach - site looks ok Cardiovascular:  Normal heart sounds Lungs:  Sync with vent and clear Abdomen:  Obese, soft Musculoskeletal:  Contractures Skin:  Chafed skin on feet and pressure ulcers +  LABS:  PULMONARY  Recent  Labs Lab 01/17/14 1836  TCO2 26    CBC  Recent Labs Lab 01/17/14 1827 01/17/14 1836  HGB 8.6* 9.2*  HCT 26.2* 27.0*  WBC 24.4*  --   PLT 346  --     COAGULATION No results found for this basename: INR,  in the last 168 hours  CARDIAC  No results found for this basename: TROPONINI,  in the last 168 hours No results found for this basename: PROBNP,  in the last 168 hours   CHEMISTRY  Recent Labs Lab 01/17/14 1827 01/17/14 1836  NA  --  134*  K  --  2.9*  CL  --  98  GLUCOSE  --  146*  BUN  --  63*  CREATININE  --  2.60*  MG 2.5  --    CrCl  is unknown because there is no height on file for the current visit.   LIVER No results found for this basename: AST, ALT, ALKPHOS, BILITOT, PROT, ALBUMIN, INR,  in the last 168 hours   INFECTIOUS  Recent Labs Lab 01/17/14 1837  LATICACIDVEN 1.39     ENDOCRINE CBG (last 3)   Recent Labs  01/17/14 1627  GLUCAP 151*         IMAGING x48h Dg Chest Portable 1 View  01/17/2014   CLINICAL DATA:  Central line placement  EXAM: PORTABLE CHEST - 1 VIEW  COMPARISON:  None.  FINDINGS: Right internal jugular central line tip identified projecting over superior vena cava at the level of the azygos arch, approximately 1.5 cm above the cavoatrial junction. No pneumothorax. Tracheostomy tube projects over the tracheal air column.  Heart size normal. There is extensive bilateral central hazy opacity. There is calcification of the aortic arch.  IMPRESSION: Central line as described. Extensive bilateral airspace disease possibly representing pulmonary edema.   Electronically Signed   By: Esperanza Heir M.D.   On: 01/17/2014 19:12       ASSESSMENT / PLAN:  PULMONARY S/p trach 10/01/13 A: Acute on chronic respiratory faillure - currently with CXR showing diffuse pulmonaryh infiltrates. Unclear if this is HCAP or acute pulmnary edema or chronic lung injury. No idea what her recent cxr at [facility] looks like. Assuming current  50% fio2 needs is real then some of the pulmonary findings could be new  P:   Full vent support Check bnp. Troponin Aim to diurese per triad Follow Needs goals of care with palliative care    Rest per [facility]   Dr. Kalman Shan, M.D., [facility].C.P Pulmonary and Critical Care Medicine Staff Physician [facility] [place] Pulmonary and Critical Care Pager: [phone], If no answer or between  15:00h - 7:00h: call [phone]  01/17/2014 10:06 PM

## 2014-01-17 NOTE — Progress Notes (Addendum)
ANTIBIOTIC CONSULT NOTE - INITIAL  Pharmacy Consult for daptomycin + primaxin Indication: rule out sepsis  No Known Allergies  Patient Measurements:    Vital Signs: Temp: 98.3 F (36.8 C) (08/31 1621) Temp src: Oral (08/31 1621) BP: 111/72 mmHg (08/31 1938) Pulse Rate: 96 (08/31 1957) Intake/Output from previous day:   Intake/Output from this shift:    Labs:  Recent Labs  01/17/14 1827 01/17/14 1836  WBC 24.4*  --   HGB 8.6* 9.2*  PLT 346  --   CREATININE  --  2.60*   CrCl is unknown because there is no height on file for the current visit. No results found for this basename: VANCOTROUGH, VANCOPEAK, VANCORANDOM, GENTTROUGH, GENTPEAK, GENTRANDOM, TOBRATROUGH, TOBRAPEAK, TOBRARND, AMIKACINPEAK, AMIKACINTROU, AMIKACIN,  in the last 72 hours   Microbiology: No results found for this or any previous visit (from the past 720 hour(s)).  Medical History: Past Medical History  Diagnosis Date  . Diabetes mellitus without complication   . Tracheostomy dependence   . Dysphagia   . Clostridium difficile diarrhea   . Gout   . Hypertension   . CVA (cerebral infarction)   . CHF (congestive heart failure)   . Anemia   . Depressed   . Neurogenic bladder   . Stroke     Medications:  Anti-infectives   Start     Dose/Rate Route Frequency Ordered Stop   01/18/14 2200  ceFEPIme (MAXIPIME) 1 g in dextrose 5 % 50 mL IVPB     1 g 100 mL/hr over 30 Minutes Intravenous Every 24 hours 01/17/14 2103     01/17/14 2115  ceFEPIme (MAXIPIME) 2 g in dextrose 5 % 50 mL IVPB     2 g 100 mL/hr over 30 Minutes Intravenous  Once 01/17/14 2103       Assessment: 69 yof with complex past medical history presented from [facility] with PEG tube leaking. She has been on multiple courses of antibiotics for several drug resistant bugs. She is to continue daptomycin here and resume primaxin. Her Scr is elevated above baseline. No height or weight information available from [facility] and patient unable  to supply.   Dapto 8/13>> (orinigal scheduled DC of 9/7) Meropenem 8/26>>8/31 Fluconazole 8/24>>8/31 Cefepime 8/5>>8/10 Linezolid 8/10>>8/24 Vanc 8/4>>8/10 Vanc PO 8/3>>8/22 Primaxin 8/10>>8/13; 8/31>>  Goal of Therapy:  Eradication of infection  Plan:  1. Primaxin to  IV Q12H 2. No further daptomycin tonight - received a dose yesterday and should likely be on Q48H dosing 3. CK now and weekly 4. F/u renal fxn, C&S, clinical status 5. F/u measured inpatient height and weight and update doses if necessary  Burns Timson, Drake Leach 01/17/2014,9:04 PM

## 2014-01-17 NOTE — ED Provider Notes (Signed)
CSN: [id number]     Arrival date & time 01/17/14  1604 History   First MD Initiated Contact with Patient 01/17/14 1705     Chief Complaint  Patient presents with  . GI Problem    PEG tube leaking     (Consider location/radiation/quality/duration/timing/severity/associated sxs/prior Treatment) HPI Tricia Reed 80 y.o. with a hsitory of CVA, CHF, trach dependence due to stroke, and recent had a PEG tube placed by Dr. Hardin Negus who presents for concern for discharge around the G tube site. History is limited as there is no documentation with the patient describing the history. There has been bloody fluid and "feeds" leaking from around the G tube site for at least the past 24 hours. Unknown when this actually started. No known exacerbating or relieving factors at this time. She can nod and shake her head to questions, but the history is limited due to this. There is also some rash around the G tube as well. This is severe in severity.   Past Medical History  Diagnosis Date  . Diabetes mellitus without complication   . Tracheostomy dependence   . Dysphagia   . Clostridium difficile diarrhea   . Gout   . Hypertension   . CVA (cerebral infarction)   . CHF (congestive heart failure)   . Anemia   . Depressed   . Neurogenic bladder   . Stroke    Past Surgical History  Procedure Laterality Date  . Peg placement    . Tracheostomy     History reviewed. No pertinent family history. History  Substance Use Topics  . Smoking status: Never Smoker   . Smokeless tobacco: Not on file  . Alcohol Use: No   OB History   Grav Para Term Preterm Abortions TAB SAB Ect Mult Living                 Review of Systems  Unable to perform ROS: Other      Allergies  Review of patient's allergies indicates no known allergies.  Home Medications   Prior to Admission medications   Medication Sig Start Date End Date Taking? Authorizing Provider  enoxaparin (LOVENOX) 30 MG/0.3ML injection  Inject 30 mg into the skin daily. Discontinued 01/12/14 12/03/13  Yes Historical Provider, MD  fluconazole (DIFLUCAN) 200-0.9 MG/100ML-% IVPB Inject 200 mg into the vein daily. For 14 days 01/10/14  Yes Historical Provider, MD  furosemide (LASIX) 10 MG/ML injection Inject 40 mg into the muscle as needed (for decreased urine output).   Yes Historical Provider, MD   BP 106/66  Pulse 101  Temp(Src) 98.3 F (36.8 C) (Oral)  Resp 25  SpO2 100% Physical Exam  Constitutional: She appears well-developed and well-nourished. No distress.  HENT:  Head: Normocephalic and atraumatic.  Right Ear: External ear normal.  Left Ear: External ear normal.  Eyes: EOM are normal. Pupils are equal, round, and reactive to light. Right eye exhibits no discharge. Left eye exhibits no discharge.  Neck: Normal range of motion. No JVD present. No tracheal deviation present.  tracheostomy noted and on a ventilator  Cardiovascular: Regular rhythm and S1 normal.  Tachycardia present.   No murmur heard. Pulmonary/Chest: No stridor. She has no wheezes. She has no rhonchi.  Abdominal: Soft. She exhibits no distension. There is no tenderness. There is no rebound.    Musculoskeletal: She exhibits no edema.       Right lower leg: Right lower leg edema: trace.       Left lower  leg: Left lower leg edema: trace.  Neurological: She is alert.  Shaking and nodding her head purposefully to questions    ED Course  Procedures (including critical care time) Labs Review Labs Reviewed  CBG MONITORING, ED - Abnormal; Notable for the following:    Glucose-Capillary 151 (*)    All other components within normal limits  CBC WITH DIFFERENTIAL  I-STAT CHEM 8, ED  I-STAT CG4 LACTIC ACID, ED    Imaging Review No results found.   EKG Interpretation None      MDM   Final diagnoses:  None    Tricia Reed 80 y.o. who is trached, has a G tube (placed on 8/11), CHF, CVA who presents for a macerated and cellulitis area  around her G tube site with blood and ?feeds draining around the G tube. History limited. + tachycardia but HDS. Cellulitis around the G tube. Abdomen is soft and not rigid. + leukocytosis. Given VRE and MRSA covered with daptomycin, and imipenem. CXR concerning for some slight pulm edema, could be resolving PNA as she is on ABX at the nursing facility. Will admit for abx. Will need step down unti. Critical care consulted for vent management. Patient was admitted without any events. Care discussed with my attending, Dr. Jodi Mourning. She was HDS while under my care. She did not require sig nificant fluid resuscitation. She did require multiple antibiotics for her high risk factors.     Sena Hitch, MD 01/17/14 2356  Sena Hitch, MD 01/17/14 [phone]

## 2014-01-17 NOTE — ED Notes (Signed)
Pt from [facility]. Pt receiving tube feedings via PEG tube. Staff noticed today that tube is leaking. Pt bedbound with trach, and foley. Pt alert and oriented. BP 132 systolic per carelink, HR 104. Pt has hx of MRSA and VRE

## 2014-01-17 NOTE — Procedures (Signed)
Central Venous Catheter Insertion Procedure Note Tricia Reed [id number] 1927-01-29  Procedure: Insertion of Central Venous Catheter Indications: Assessment of intravascular volume, Drug and/or fluid administration and Frequent blood sampling  Procedure Details Consent: Risks of procedure as well as the alternatives and risks of each were explained to the (patient/caregiver).  Consent for procedure obtained. Time Out: Verified patient identification, verified procedure, site/side was marked, verified correct patient position, special equipment/implants available, medications/allergies/relevent history reviewed, required imaging and test results available.  Performed  Maximum sterile technique was used including antiseptics, cap, gloves, gown, hand hygiene, mask and sheet. Skin prep: Chlorhexidine; local anesthetic administered A antimicrobial bonded/coated triple lumen catheter was placed in the left internal jugular vein using the Seldinger technique.  Evaluation Blood flow good Complications: No apparent complications Patient did tolerate procedure well. Chest X-ray ordered to verify placement.  CXR: pending.  Procedure performed under direct ultrasound guidance for real time vessel cannulation.      Rutherford Guys, PA - [facility] [place] Pul[facility] & Critical Care Medicine Pgr: [phone]  or [phone]

## 2014-01-17 NOTE — ED Notes (Signed)
NOTIFIED DR. ZAVITZ FOR PATIENTS PANIC LAB RESULTS OF I-STAT CHEM 8+ K +2.9 mmol/L , :45 PM ,01/17/2014.

## 2014-01-17 NOTE — ED Notes (Signed)
Checking placement of pt's central line with Xray and will administer medications if line is in correct place.

## 2014-01-17 NOTE — ED Notes (Signed)
Pt came with foley catheter, trach attached to vent, PEG tube, and double lumen CVC.

## 2014-01-18 ENCOUNTER — Encounter (HOSPITAL_COMMUNITY): Payer: Self-pay | Admitting: Radiology

## 2014-01-18 ENCOUNTER — Inpatient Hospital Stay (HOSPITAL_COMMUNITY): Payer: Medicare Other

## 2014-01-18 DIAGNOSIS — L0390 Cellulitis, unspecified: Secondary | ICD-10-CM

## 2014-01-18 DIAGNOSIS — K9422 Gastrostomy infection: Secondary | ICD-10-CM

## 2014-01-18 DIAGNOSIS — N179 Acute kidney failure, unspecified: Secondary | ICD-10-CM

## 2014-01-18 DIAGNOSIS — L8990 Pressure ulcer of unspecified site, unspecified stage: Secondary | ICD-10-CM

## 2014-01-18 DIAGNOSIS — K559 Vascular disorder of intestine, unspecified: Secondary | ICD-10-CM

## 2014-01-18 DIAGNOSIS — L0291 Cutaneous abscess, unspecified: Secondary | ICD-10-CM

## 2014-01-18 DIAGNOSIS — E119 Type 2 diabetes mellitus without complications: Secondary | ICD-10-CM

## 2014-01-18 LAB — PHOSPHORUS: PHOSPHORUS: 4.3 mg/dL (ref 2.3–4.6)

## 2014-01-18 LAB — CK: Total CK: 47 U/L (ref 7–177)

## 2014-01-18 LAB — CBC
HCT: 28.4 % — ABNORMAL LOW (ref 36.0–46.0)
Hemoglobin: 9.5 g/dL — ABNORMAL LOW (ref 12.0–15.0)
MCH: 27.9 pg (ref 26.0–34.0)
MCHC: 33.5 g/dL (ref 30.0–36.0)
MCV: 83.3 fL (ref 78.0–100.0)
PLATELETS: 291 10*3/uL (ref 150–400)
RBC: 3.41 MIL/uL — ABNORMAL LOW (ref 3.87–5.11)
RDW: 16.8 % — AB (ref 11.5–15.5)
WBC: 18.4 10*3/uL — ABNORMAL HIGH (ref 4.0–10.5)

## 2014-01-18 LAB — TROPONIN I: Troponin I: <0.3 ng/mL (ref <0.30)

## 2014-01-18 LAB — COMPREHENSIVE METABOLIC PANEL
ALBUMIN: 1.1 g/dL — AB (ref 3.5–5.2)
ALT: 7 U/L (ref 0–35)
AST: 17 U/L (ref 0–37)
Alkaline Phosphatase: 133 U/L — ABNORMAL HIGH (ref 39–117)
Anion gap: 13 (ref 5–15)
BILIRUBIN TOTAL: 0.2 mg/dL — AB (ref 0.3–1.2)
BUN: 73 mg/dL — ABNORMAL HIGH (ref 6–23)
CO2: 27 mEq/L (ref 19–32)
CREATININE: 2.44 mg/dL — AB (ref 0.50–1.10)
Calcium: 7.9 mg/dL — ABNORMAL LOW (ref 8.4–10.5)
Chloride: 97 mEq/L (ref 96–112)
GFR calc Af Amer: 20 mL/min — ABNORMAL LOW (ref >90)
GFR calc non Af Amer: 17 mL/min — ABNORMAL LOW (ref >90)
Glucose, Bld: 124 mg/dL — ABNORMAL HIGH (ref 70–99)
POTASSIUM: 3.2 meq/L — AB (ref 3.7–5.3)
Sodium: 137 mEq/L (ref 137–147)
TOTAL PROTEIN: 5.4 g/dL — AB (ref 6.0–8.3)

## 2014-01-18 LAB — GLUCOSE, CAPILLARY
GLUCOSE-CAPILLARY: 119 mg/dL — AB (ref 70–99)
GLUCOSE-CAPILLARY: 171 mg/dL — AB (ref 70–99)
Glucose-Capillary: 142 mg/dL — ABNORMAL HIGH (ref 70–99)
Glucose-Capillary: 132 mg/dL — ABNORMAL HIGH (ref 70–99)
Glucose-Capillary: 132 mg/dL — ABNORMAL HIGH (ref 70–99)

## 2014-01-18 LAB — CLOSTRIDIUM DIFFICILE BY PCR: CDIFFPCR: NEGATIVE

## 2014-01-18 LAB — PRO B NATRIURETIC PEPTIDE: PRO B NATRI PEPTIDE: 17040 pg/mL — AB (ref 0–450)

## 2014-01-18 LAB — MAGNESIUM: Magnesium: 2.5 mg/dL (ref 1.5–2.5)

## 2014-01-18 LAB — MRSA PCR SCREENING: MRSA by PCR: NEGATIVE

## 2014-01-18 MED ORDER — POTASSIUM CHLORIDE 10 MEQ/50ML IV SOLN
10.0000 meq | INTRAVENOUS | Status: AC
  Administered 2014-01-18 (×3): 10 meq via INTRAVENOUS
  Filled 2014-01-18 (×3): qty 50

## 2014-01-18 MED ORDER — DAPTOMYCIN 500 MG IV SOLR
500.0000 mg | INTRAVENOUS | Status: DC
  Administered 2014-01-18: 500 mg via INTRAVENOUS
  Filled 2014-01-18 (×2): qty 10

## 2014-01-18 MED ORDER — HEPARIN SODIUM (PORCINE) 5000 UNIT/ML IJ SOLN
5000.0000 [IU] | Freq: Three times a day (TID) | INTRAMUSCULAR | Status: DC
  Administered 2014-01-18 – 2014-01-25 (×22): 5000 [IU] via SUBCUTANEOUS
  Filled 2014-01-18 (×23): qty 1

## 2014-01-18 MED ORDER — TRACE MINERALS CR-CU-F-FE-I-MN-MO-SE-ZN IV SOLN
INTRAVENOUS | Status: AC
  Administered 2014-01-18: 18:00:00 via INTRAVENOUS
  Filled 2014-01-18: qty 1000

## 2014-01-18 MED ORDER — POTASSIUM CHLORIDE 10 MEQ/100ML IV SOLN
10.0000 meq | INTRAVENOUS | Status: AC
  Administered 2014-01-18 (×2): 10 meq via INTRAVENOUS
  Filled 2014-01-18 (×2): qty 100

## 2014-01-18 MED ORDER — DAPTOMYCIN 500 MG IV SOLR
500.0000 mg | INTRAVENOUS | Status: DC
  Administered 2014-01-20 – 2014-01-24 (×3): 500 mg via INTRAVENOUS
  Filled 2014-01-18 (×7): qty 10

## 2014-01-18 MED ORDER — PANTOPRAZOLE SODIUM 40 MG IV SOLR
40.0000 mg | INTRAVENOUS | Status: DC
  Administered 2014-01-18 – 2014-01-25 (×8): 40 mg via INTRAVENOUS
  Filled 2014-01-18 (×8): qty 40

## 2014-01-18 MED ORDER — FAT EMULSION 20 % IV EMUL
250.0000 mL | INTRAVENOUS | Status: AC
  Administered 2014-01-18: 250 mL via INTRAVENOUS
  Filled 2014-01-18: qty 250

## 2014-01-18 NOTE — Procedures (Signed)
I was not present for this procedure  Dr. Kalman Shan, M.D., [facility].C.P Pulmonary and Critical Care Medicine Staff Physician [facility] [place] Pulmonary and Critical Care Pager: [phone], If no answer or between  15:00h - 7:00h: call [phone]  01/18/2014 11:19 AM

## 2014-01-18 NOTE — ED Provider Notes (Signed)
Medical screening examination/treatment/procedure(s) were conducted as a shared visit with non-physician practitioner(s) or resident and myself. I personally evaluated the patient during the encounter and agree with the findings.  I have personally reviewed any xrays and/ or EKG's with the provider and I agree with interpretation.  Patient, the medical history including diabetes, CVA, nursing home and vent dependence, C. difficile, high blood pressure and stroke and heart failure presents with dysfunctional G-tube for unknown duration. On exam patient has mild induration warmth with stomach contents coming out surrounding widened G-tube opening. Opening approximately 2 cm diameter. Abdomen mild distended no focal pain or guarding. Patient has tracheostomy in place, general weakness bilateral, tachypnea, patient alert to voice. Concern for sepsis. Plan for blood work, surgery consult and stepdown admission.  CRITICAL CARE Performed by: Enid Skeens   Total critical care time: 35 min  Critical care time was exclusive of separately billable procedures and treating other patients.  Critical care was necessary to treat or prevent imminent or life-threatening deterioration.  Critical care was time spent personally by me on the following activities: development of treatment plan with patient and/or surrogate as well as nursing, discussions with consultants, evaluation of patient's response to treatment, examination of patient, obtaining history from patient or surrogate, ordering and performing treatments and interventions, ordering and review of laboratory studies, ordering and review of radiographic studies, pulse oximetry and re-evaluation of patient's condition.   Enid Skeens, MD 01/18/14 Marlyne Beards

## 2014-01-18 NOTE — Progress Notes (Signed)
PULMONARY / CRITICAL CARE MEDICINE   Name: Tricia Reed MRN: [id number] DOB: Jun 27, 1926    ADMISSION DATE:  01/17/2014 CONSULTATION DATE:  01/17/2014   REFERRING MD :  [facility] hospitalist  CHIEF COMPLAINT:  Chronic respiratory failure     Brief summary:  80 year old AA female. Admitted from [facility] after PEG malposition. PCCM consulted for vent mgmt - chronic respiratory failure.   SUBJ: RASS 0. Full vent support. No distress   VITAL SIGNS: Temp:  [97.3 F (36.3 C)-98.5 F (36.9 C)] 97.3 F (36.3 C) (09/01 1149) Pulse Rate:  [40-111] 91 (09/01 1149) Resp:  [0-32] 18 (09/01 1149) BP: (77-127)/(29-75) 127/62 mmHg (09/01 1149) SpO2:  [93 %-100 %] 99 % (09/01 1100) FiO2 (%):  [30 %-50 %] 30 % (09/01 1149) Weight:  [85.7 kg (188 lb 15 oz)] 85.7 kg (188 lb 15 oz) (08/31 2200) HEMODYNAMICS:   VENTILATOR SETTINGS: Vent Mode:  [-] PRVC FiO2 (%):  [30 %-50 %] 30 % Set Rate:  [16 bmp-24 bmp] 16 bmp Vt Set:  [450 mL] 450 mL PEEP:  [5 cmH20] 5 cmH20 Plateau Pressure:  [22 cmH20-32 cmH20] 31 cmH20 INTAKE / OUTPUT:  Intake/Output Summary (Last 24 hours) at 01/18/14 1332 Last data filed at 01/18/14 0816  Gross per 24 hour  Intake      0 ml  Output    100 ml  Net   -100 ml    PHYSICAL EXAMINATION: General:  NAD Neuro:  RASS 0. Diffusely weak HEENT:  WNL Cardiovascular:  Reg with xtrasystoles, no M noted Lungs:  Clear anteriorly Abdomen:  Obese, soft, NT Ext: + symmetric edema   LABS: I have reviewed all of today's lab results. Relevant abnormalities are discussed in the A/P section  CXR: edema pattern   ASSESSMENT / PLAN:  PULMONARY A:  Chronic respiratory failure/vent dependence/trach status  Pulm edema pattern on CXR Doubt PNA PEG cellulitis - ID managing abx G tube malfunction - CCS evaluating and managing P:   Cont full vent support - settings reviewed and adjusted Cont vent bundle Rest per TRH   She is an extremely poor candidate for this sort of care  as it is highly unlikely that anything resembling a functional outcome will ensue. Would consider addressing EOL issues with input from Palliative Care. She should not be considered a candidate for ACLS in the event of cardiac arrest. Nothing good would come of ACLS interventions.   PCCM will be available as needed    Billy Fischer, MD ; [facility] [phone].  After 5:30 PM or weekends, call [phone]   01/18/2014 1:32 PM

## 2014-01-18 NOTE — Consult Note (Signed)
[facility]  Date of Admission:  01/17/2014  Date of Consult:  01/18/2014  Reason for Consult: Peg cellulitis Referring Physician: Alcario Drought  Impression/Recommendation: PEG cellulitis ARF DM Ischemic Colitis Decubitus Ulcer Previous Funguria Previous UTI (k oxytoca- multiple drug resistant) Severe protien calorie malunutrition (alb 1.1)  Would: Await CT abd Diurese, f/u CXR Watch wbc Stop diflucan Continue dapto/imipenem til CT of abd resulted Check CK Nutrition f/u.   Comment- I am not sure that she will clear her urine given her indwelling foley, poor overall status.  Agree that she needs EOL planning.   Thank you so much for this interesting consult,   Bobby Rumpf (pager) [phone] [email or web address]  Tricia Reed is an 80 y.o. female.  HPI: 80 yo F with hx of DM, STEMI/CABG who then had VDRF. She was transferred to [facility] July 2015. Her course there was complicated by failure to wean, lacunar infarct, question of C diff colitis, funguria, UTI with klebsiella.  She was sent to [facility] on 8-31 with cellulitis around her PEG site. She was merrem, fluconazole, daptomycin at [facility].  She was afebrile, WBC of 24.4. Her CXR showed "Moderate bilateral interstitial and airspace opacities". She is awaiting CT of abd to f/u her PEG site.   UCx (01-06-14) 50-100k yeast. 25-50k Kleb oxytoca (s-imipenem, bactrim, doxy)  She is awake and comfortable, without complaints or pain.    Past Medical History  Diagnosis Date  . Diabetes mellitus without complication   . Tracheostomy dependence   . Dysphagia   . Clostridium difficile diarrhea   . Gout   . Hypertension   . CVA (cerebral infarction)   . CHF (congestive heart failure)   . Anemia   . Depressed   . Neurogenic bladder   . Stroke   . STEMI (ST elevation myocardial infarction)     Past Surgical History  Procedure Laterality Date  . Peg placement    . Tracheostomy       No  Known Allergies  Medications:  Scheduled: . chlorhexidine  15 mL Mouth/Throat BID  . DAPTOmycin (CUBICIN)  IV  500 mg Intravenous Q24H  . fluconazole  200 mg Intravenous Q24H  . furosemide  40 mg Intravenous Daily  . imipenem-cilastatin  250 mg Intravenous Q12H  . insulin aspart  0-9 Units Subcutaneous 6 times per day  . metoprolol  5 mg Intravenous Q6H  . pantoprazole (PROTONIX) IV  40 mg Intravenous Q24H  . potassium chloride  10 mEq Intravenous Q1 Hr x 3    Abtx:  Anti-infectives   Start     Dose/Rate Route Frequency Ordered Stop   01/18/14 2200  ceFEPIme (MAXIPIME) 1 g in dextrose 5 % 50 mL IVPB  Status:  Discontinued     1 g 100 mL/hr over 30 Minutes Intravenous Every 24 hours 01/17/14 2103 01/17/14 2108   01/18/14 1000  DAPTOmycin (CUBICIN) 500 mg in sodium chloride 0.9 % IVPB     500 mg 220 mL/hr over 30 Minutes Intravenous Every 24 hours 01/18/14 0904     01/18/14 0000  fluconazole (DIFLUCAN) IVPB 200 mg     200 mg 100 mL/hr over 60 Minutes Intravenous Every 24 hours 01/17/14 2128 01/24/14 2359   01/17/14 2200  imipenem-cilastatin (PRIMAXIN) 500 mg in sodium chloride 0.9 % 100 mL IVPB  Status:  Discontinued     500 mg 200 mL/hr over 30 Minutes Intravenous 3 times per day 01/17/14 2107 01/17/14 2111   01/17/14 2130  imipenem-cilastatin (  PRIMAXIN) 250 mg in sodium chloride 0.9 % 100 mL IVPB     250 mg 200 mL/hr over 30 Minutes Intravenous Every 12 hours 01/17/14 2112     01/17/14 2115  ceFEPIme (MAXIPIME) 2 g in dextrose 5 % 50 mL IVPB  Status:  Discontinued     2 g 100 mL/hr over 30 Minutes Intravenous  Once 01/17/14 2103 01/17/14 2108      Total days of antibiotics ?          Social History:  reports that she has never smoked. She does not have any smokeless tobacco history on file. She reports that she does not drink alcohol or use illicit drugs.  History reviewed. No pertinent family history.  General ROS: see HPI.   Blood pressure 119/62, pulse 77,  temperature 97.8 F (36.6 C), temperature source Axillary, resp. rate 27, height 5' 2" (1.575 m), weight 85.7 kg (188 lb 15 oz), SpO2 100.00%. Eyes: negative findings: pupils equal, round, reactive to light and accomodation Throat: poor dentition.  Neck: trach site clean.  Lungs: diminished breath sounds bibasilar Heart: regular rate and rhythm Abdomen: normal findings: bowel sounds normal and soft, non-tender and PEG site is eroded, no d/c. looked similar to decubitus with skin breakdown.  Extremities: edema 3+ BLE   Results for orders placed during the hospital encounter of 01/17/14 (from the past 48 hour(s))  CBG MONITORING, ED     Status: Abnormal   Collection Time    01/17/14  4:27 PM      Result Value Ref Range   Glucose-Capillary 151 (*) 70 - 99 mg/dL   Comment 1 Documented in Chart     Comment 2 Notify RN    CBC WITH DIFFERENTIAL     Status: Abnormal   Collection Time    01/17/14  6:27 PM      Result Value Ref Range   WBC 24.4 (*) 4.0 - 10.5 K/uL   RBC 3.14 (*) 3.87 - 5.11 MIL/uL   Hemoglobin 8.6 (*) 12.0 - 15.0 g/dL   HCT 26.2 (*) 36.0 - 46.0 %   MCV 83.4  78.0 - 100.0 fL   MCH 27.4  26.0 - 34.0 pg   MCHC 32.8  30.0 - 36.0 g/dL   RDW 16.9 (*) 11.5 - 15.5 %   Platelets 346  150 - 400 K/uL   Neutrophils Relative % 88 (*) 43 - 77 %   Neutro Abs 21.4 (*) 1.7 - 7.7 K/uL   Lymphocytes Relative 7 (*) 12 - 46 %   Lymphs Abs 1.7  0.7 - 4.0 K/uL   Monocytes Relative 4  3 - 12 %   Monocytes Absolute 1.0  0.1 - 1.0 K/uL   Eosinophils Relative 1  0 - 5 %   Eosinophils Absolute 0.2  0.0 - 0.7 K/uL   Basophils Relative 0  0 - 1 %   Basophils Absolute 0.1  0.0 - 0.1 K/uL  MAGNESIUM     Status: None   Collection Time    01/17/14  6:27 PM      Result Value Ref Range   Magnesium 2.5  1.5 - 2.5 mg/dL  CK     Status: None   Collection Time    01/17/14  6:27 PM      Result Value Ref Range   Total CK 32  7 - 177 U/L  I-STAT CHEM 8, ED     Status: Abnormal   Collection Time  01/17/14  6:36 PM      Result Value Ref Range   Sodium 134 (*) 137 - 147 mEq/L   Potassium 2.9 (*) 3.7 - 5.3 mEq/L   Chloride 98  96 - 112 mEq/L   BUN 63 (*) 6 - 23 mg/dL   Creatinine, Ser 2.60 (*) 0.50 - 1.10 mg/dL   Glucose, Bld 146 (*) 70 - 99 mg/dL   Calcium, Ion 1.05 (*) 1.13 - 1.30 mmol/L   TCO2 26  0 - 100 mmol/L   Hemoglobin 9.2 (*) 12.0 - 15.0 g/dL   HCT 27.0 (*) 36.0 - 46.0 %   Comment NOTIFIED PHYSICIAN    I-STAT CG4 LACTIC ACID, ED     Status: None   Collection Time    01/17/14  6:37 PM      Result Value Ref Range   Lactic Acid, Venous 1.39  0.5 - 2.2 mmol/L  URINALYSIS, ROUTINE W REFLEX MICROSCOPIC     Status: Abnormal   Collection Time    01/17/14  8:30 PM      Result Value Ref Range   Color, Urine AMBER (*) YELLOW   Comment: BIOCHEMICALS MAY BE AFFECTED BY COLOR   APPearance TURBID (*) CLEAR   Specific Gravity, Urine 1.018  1.005 - 1.030   pH 5.0  5.0 - 8.0   Glucose, UA NEGATIVE  NEGATIVE mg/dL   Hgb urine dipstick MODERATE (*) NEGATIVE   Bilirubin Urine NEGATIVE  NEGATIVE   Ketones, ur NEGATIVE  NEGATIVE mg/dL   Protein, ur 100 (*) NEGATIVE mg/dL   Urobilinogen, UA 0.2  0.0 - 1.0 mg/dL   Nitrite NEGATIVE  NEGATIVE   Leukocytes, UA LARGE (*) NEGATIVE  URINE MICROSCOPIC-ADD ON     Status: Abnormal   Collection Time    01/17/14  8:30 PM      Result Value Ref Range   Squamous Epithelial / LPF MANY (*) RARE   WBC, UA 21-50  <3 WBC/hpf   RBC / HPF 7-10  <3 RBC/hpf   Bacteria, UA MANY (*) RARE   Urine-Other AMORPHOUS URATES/PHOSPHATES    TROPONIN I     Status: None   Collection Time    01/17/14 10:21 PM      Result Value Ref Range   Troponin I <0.30  <0.30 ng/mL   Comment:            Due to the release kinetics of cTnI,     a negative result within the first hours     of the onset of symptoms does not rule out     myocardial infarction with certainty.     If myocardial infarction is still suspected,     repeat the test at appropriate intervals.  MRSA  PCR SCREENING     Status: None   Collection Time    01/17/14 11:08 PM      Result Value Ref Range   MRSA by PCR NEGATIVE  NEGATIVE   Comment:            The GeneXpert MRSA Assay (FDA     approved for NASAL specimens     only), is one component of a     comprehensive MRSA colonization     surveillance program. It is not     intended to diagnose MRSA     infection nor to guide or     monitor treatment for     MRSA infections.  GLUCOSE, CAPILLARY     Status: Abnormal  Collection Time    01/18/14  4:05 AM      Result Value Ref Range   Glucose-Capillary 142 (*) 70 - 99 mg/dL  CBC     Status: Abnormal   Collection Time    01/18/14  4:06 AM      Result Value Ref Range   WBC 18.4 (*) 4.0 - 10.5 K/uL   RBC 3.41 (*) 3.87 - 5.11 MIL/uL   Hemoglobin 9.5 (*) 12.0 - 15.0 g/dL   HCT 28.4 (*) 36.0 - 46.0 %   MCV 83.3  78.0 - 100.0 fL   MCH 27.9  26.0 - 34.0 pg   MCHC 33.5  30.0 - 36.0 g/dL   RDW 16.8 (*) 11.5 - 15.5 %   Platelets 291  150 - 400 K/uL  COMPREHENSIVE METABOLIC PANEL     Status: Abnormal   Collection Time    01/18/14  4:06 AM      Result Value Ref Range   Sodium 137  137 - 147 mEq/L   Potassium 3.2 (*) 3.7 - 5.3 mEq/L   Chloride 97  96 - 112 mEq/L   CO2 27  19 - 32 mEq/L   Glucose, Bld 124 (*) 70 - 99 mg/dL   BUN 73 (*) 6 - 23 mg/dL   Creatinine, Ser 2.44 (*) 0.50 - 1.10 mg/dL   Calcium 7.9 (*) 8.4 - 10.5 mg/dL   Total Protein 5.4 (*) 6.0 - 8.3 g/dL   Albumin 1.1 (*) 3.5 - 5.2 g/dL   AST 17  0 - 37 U/L   ALT 7  0 - 35 U/L   Alkaline Phosphatase 133 (*) 39 - 117 U/L   Total Bilirubin 0.2 (*) 0.3 - 1.2 mg/dL   GFR calc non Af Amer 17 (*) >90 mL/min   GFR calc Af Amer 20 (*) >90 mL/min   Comment: (NOTE)     The eGFR has been calculated using the CKD EPI equation.     This calculation has not been validated in all clinical situations.     eGFR's persistently <90 mL/min signify possible Chronic Kidney     Disease.   Anion gap 13  5 - 15  PRO B NATRIURETIC PEPTIDE      Status: Abnormal   Collection Time    01/18/14  4:07 AM      Result Value Ref Range   Pro B Natriuretic peptide (BNP) 17040.0 (*) 0 - 450 pg/mL  TROPONIN I     Status: None   Collection Time    01/18/14  4:07 AM      Result Value Ref Range   Troponin I <0.30  <0.30 ng/mL   Comment:            Due to the release kinetics of cTnI,     a negative result within the first hours     of the onset of symptoms does not rule out     myocardial infarction with certainty.     If myocardial infarction is still suspected,     repeat the test at appropriate intervals.  MAGNESIUM     Status: None   Collection Time    01/18/14  4:07 AM      Result Value Ref Range   Magnesium 2.5  1.5 - 2.5 mg/dL  PHOSPHORUS     Status: None   Collection Time    01/18/14  4:07 AM      Result Value Ref Range   Phosphorus 4.3  2.3 - 4.6 mg/dL  GLUCOSE, CAPILLARY     Status: Abnormal   Collection Time    01/18/14  8:13 AM      Result Value Ref Range   Glucose-Capillary 132 (*) 70 - 99 mg/dL   No results found for this basename: sdes, specrequest, cult, reptstatus   Dg Chest Port 1 View  01/18/2014   CLINICAL DATA:  line placement  EXAM: PORTABLE CHEST - 1 VIEW  COMPARISON:  01/17/2014  FINDINGS: Right ij central line has been removed and a new left ij central line placed to the distal svc. no pneumothorax. tracheostomy stable. previous median sternotomy. moderate diffuse interstitial and airspace opacities throughout both lungs, involving bases more than apices, perhaps marginally improved since previous exam. Suspect small pleural effusions. Heart size upper limits normal.  Degenerative changes in bilateral shoulders. Spurring in the thoracic spine.  IMPRESSION: 1. Central line to the distal SVC without pneumothorax. 2. Moderate bilateral interstitial and airspace opacities, perhaps marginally improved since previous film.   Electronically Signed   By: Arne Cleveland M.D.   On: 01/18/2014 00:57   Dg Chest  Portable 1 View  01/17/2014   CLINICAL DATA:  Central line placement  EXAM: PORTABLE CHEST - 1 VIEW  COMPARISON:  None.  FINDINGS: Right internal jugular central line tip identified projecting over superior vena cava at the level of the azygos arch, approximately 1.5 cm above the cavoatrial junction. No pneumothorax. Tracheostomy tube projects over the tracheal air column.  Heart size normal. There is extensive bilateral central hazy opacity. There is calcification of the aortic arch.  IMPRESSION: Central line as described. Extensive bilateral airspace disease possibly representing pulmonary edema.   Electronically Signed   By: Skipper Cliche M.D.   On: 01/17/2014 19:12   Recent Results (from the past 240 hour(s))  MRSA PCR SCREENING     Status: None   Collection Time    01/17/14 11:08 PM      Result Value Ref Range Status   MRSA by PCR NEGATIVE  NEGATIVE Final   Comment:            The GeneXpert MRSA Assay (FDA     approved for NASAL specimens     only), is one component of a     comprehensive MRSA colonization     surveillance program. It is not     intended to diagnose MRSA     infection nor to guide or     monitor treatment for     MRSA infections.      01/18/2014, 11:06 AM     LOS: 1 day     **Disclaimer: This note may have been dictated with voice recognition software. Similar sounding words can inadvertently be transcribed and this note may contain transcription errors which may not have been corrected upon publication of note.**
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
## 2014-01-18 NOTE — Progress Notes (Signed)
Patient Demographics  Tricia Reed, is a 80 y.o. female, DOB - 02/27/1927, JYN:[id number]  Admit date - 01/17/2014   Admitting Physician Hillary Bow, DO  Outpatient Primary MD for the patient is No primary provider on file.  LOS - 1   Chief Complaint  Patient presents with  . GI Problem    PEG tube leaking        Subjective:   Tricia Reed today has, No headache, No chest pain, No abdominal pain - No Nausea, No new weakness tingling or numbness, No Cough - SOB.   Answers with nod of head appears comfortable.  Assessment & Plan    1. PEG tube site leak. Pending CT abdomen pelvis, general surgery following. Will defer management to Gen. surgery. For now no tube feeds. TNA ordered.     2. Chronic respiratory failure. Ventilatory dependent, vent settings noted, pulmonary following for this problem.    3. Renal failure. baseline  appears to be close to 2, monitor with hydration..    4. Severe potentially malnutrition with third spacing of fluid. Currently on TNA. Lasix as tolerated by blood pressure. Poor prognosis.    5. Cellulitis from the PEG tube site. History of K.Oxytoca UTI, Funguria, differential colitis. ID was consulted upon admission will await their input. Currently on daptomycin, fluconazole and Primaxin.    6. Recent MI. Status post CABG several months ago resulting in respiratory failure and tracheostomy. No acute issues supportive care.    7. Diabetes mellitus type 2. On sliding scale.    8. Anemia of chronic disease. Monitor. Heme-positive stool. Place on PPI IV and monitor H&H. For now heparin for DVT prophylaxis.      Code Status: Full, condition is guarded and prognosis extremely poor  Family Communication: None present  Disposition Plan:  LTAC   Procedures chest x-ray, CT abdomen pelvis, left subclavian central line placed 01/18/2014   Consults  Gen. surgery, pulmonary, ID   Medications  Scheduled Meds: . chlorhexidine  15 mL Mouth/Throat BID  . DAPTOmycin (CUBICIN)  IV  500 mg Intravenous Q24H  . fluconazole  200 mg Intravenous Q24H  . furosemide  40 mg Intravenous Daily  . imipenem-cilastatin  250 mg Intravenous Q12H  . insulin aspart  0-9 Units Subcutaneous 6 times per day  . metoprolol  5 mg Intravenous Q6H  . pantoprazole (PROTONIX) IV  40 mg Intravenous Q24H  . potassium chloride  10 mEq Intravenous Q1 Hr x 3   Continuous Infusions: . dextrose 5 % and 0.45% NaCl 50 mL/hr at 01/18/14 0313  . Marland KitchenTPN (CLINIMIX-E) Adult     And  . fat emulsion     PRN Meds:.  DVT Prophylaxis  Heparin  Lab Results  Component Value Date   PLT 291 01/18/2014    Antibiotics     Anti-infectives   Start     Dose/Rate Route Frequency Ordered Stop   01/18/14 2200  ceFEPIme (MAXIPIME) 1 g in dextrose 5 % 50 mL IVPB  Status:  Discontinued     1 g 100 mL/hr over 30 Minutes Intravenous Every 24 hours 01/17/14 2103 01/17/14 2108   01/18/14 1000  DAPTOmycin (CUBICIN) 500 mg in sodium chloride 0.9 % IVPB     500  mg 220 mL/hr over 30 Minutes Intravenous Every 24 hours 01/18/14 0904     01/18/14 0000  fluconazole (DIFLUCAN) IVPB 200 mg     200 mg 100 mL/hr over 60 Minutes Intravenous Every 24 hours 01/17/14 2128 01/24/14 2359   01/17/14 2200  imipenem-cilastatin (PRIMAXIN) 500 mg in sodium chloride 0.9 % 100 mL IVPB  Status:  Discontinued     500 mg 200 mL/hr over 30 Minutes Intravenous 3 times per day 01/17/14 2107 01/17/14 2111   01/17/14 2130  imipenem-cilastatin (PRIMAXIN) 250 mg in sodium chloride 0.9 % 100 mL IVPB     250 mg 200 mL/hr over 30 Minutes Intravenous Every 12 hours 01/17/14 2112     01/17/14 2115  ceFEPIme (MAXIPIME) 2 g in dextrose 5 % 50 mL IVPB  Status:  Discontinued     2 g 100 mL/hr over 30 Minutes  Intravenous  Once 01/17/14 2103 01/17/14 2108          Objective:   Filed Vitals:   01/18/14 0700 01/18/14 0749 01/18/14 0800 01/18/14 1015  BP: 104/58 104/58 91/62 119/62  Pulse:  87 77   Temp:   97.8 F (36.6 C)   TempSrc:   Axillary   Resp:  24 27   Height:      Weight:      SpO2: 100% 100% 100%     Wt Readings from Last 3 Encounters:  01/17/14 85.7 kg (188 lb 15 oz)     Intake/Output Summary (Last 24 hours) at 01/18/14 1143 Last data filed at 01/18/14 0816  Gross per 24 hour  Intake      0 ml  Output    100 ml  Net   -100 ml     Physical Exam  Awake Alert, Oriented X 3, No new F.N deficits,  Trach site appears stable 2 ventilator. [place].AT,PERRAL Supple Neck,No JVD, No cervical lymphadenopathy appriciated.  Symmetrical Chest wall movement, Good air movement bilaterally, CTAB RRR,No Gallops,Rubs or new Murmurs, No Parasternal Heave +ve B.Sounds, Abd Soft, No tenderness, No organomegaly appriciated, No rebound - guarding or rigidity. PEG tube site has underlying signs of chemical injury and possible cellulitis Has Foley catheter No Cyanosis, Clubbing or edema, No new Rash or bruise      Data Review   Micro Results Recent Results (from the past 240 hour(s))  MRSA PCR SCREENING     Status: None   Collection Time    01/17/14 11:08 PM      Result Value Ref Range Status   MRSA by PCR NEGATIVE  NEGATIVE Final   Comment:            The GeneXpert MRSA Assay (FDA     approved for NASAL specimens     only), is one component of a     comprehensive MRSA colonization     surveillance program. It is not     intended to diagnose MRSA     infection nor to guide or     monitor treatment for     MRSA infections.    Radiology Reports Dg Chest Port 1 View  01/18/2014   CLINICAL DATA:  line placement  EXAM: PORTABLE CHEST - 1 VIEW  COMPARISON:  01/17/2014  FINDINGS: Right ij central line has been removed and a new left ij central line placed to the distal svc. no  pneumothorax. tracheostomy stable. previous median sternotomy. moderate diffuse interstitial and airspace opacities throughout both lungs, involving bases more than apices, perhaps marginally  improved since previous exam. Suspect small pleural effusions. Heart size upper limits normal.  Degenerative changes in bilateral shoulders. Spurring in the thoracic spine.  IMPRESSION: 1. Central line to the distal SVC without pneumothorax. 2. Moderate bilateral interstitial and airspace opacities, perhaps marginally improved since previous film.   Electronically Signed   By: Oley Balm M.D.   On: 01/18/2014 00:57   Dg Chest Portable 1 View  01/17/2014   CLINICAL DATA:  Central line placement  EXAM: PORTABLE CHEST - 1 VIEW  COMPARISON:  None.  FINDINGS: Right internal jugular central line tip identified projecting over superior vena cava at the level of the azygos arch, approximately 1.5 cm above the cavoatrial junction. No pneumothorax. Tracheostomy tube projects over the tracheal air column.  Heart size normal. There is extensive bilateral central hazy opacity. There is calcification of the aortic arch.  IMPRESSION: Central line as described. Extensive bilateral airspace disease possibly representing pulmonary edema.   Electronically Signed   By: Esperanza Heir M.D.   On: 01/17/2014 19:12    CBC  Recent Labs Lab 01/17/14 1827 01/17/14 1836 01/18/14 0406  WBC 24.4*  --  18.4*  HGB 8.6* 9.2* 9.5*  HCT 26.2* 27.0* 28.4*  PLT 346  --  291  MCV 83.4  --  83.3  MCH 27.4  --  27.9  MCHC 32.8  --  33.5  RDW 16.9*  --  16.8*  LYMPHSABS 1.7  --   --   MONOABS 1.0  --   --   EOSABS 0.2  --   --   BASOSABS 0.1  --   --     Chemistries   Recent Labs Lab 01/17/14 1827 01/17/14 1836 01/18/14 0406 01/18/14 0407  NA  --  134* 137  --   K  --  2.9* 3.2*  --   CL  --  98 97  --   CO2  --   --  27  --   GLUCOSE  --  146* 124*  --   BUN  --  63* 73*  --   CREATININE  --  2.60* 2.44*  --   CALCIUM   --   --  7.9*  --   MG 2.5  --   --  2.5  AST  --   --  17  --   ALT  --   --  7  --   ALKPHOS  --   --  133*  --   BILITOT  --   --  0.2*  --    ------------------------------------------------------------------------------------------------------------------ estimated creatinine clearance is 17.1 ml/min (by C-G formula based on Cr of 2.44). ------------------------------------------------------------------------------------------------------------------ No results found for this basename: HGBA1C,  in the last 72 hours ------------------------------------------------------------------------------------------------------------------ No results found for this basename: CHOL, HDL, LDLCALC, TRIG, CHOLHDL, LDLDIRECT,  in the last 72 hours ------------------------------------------------------------------------------------------------------------------ No results found for this basename: TSH, T4TOTAL, FREET3, T3FREE, THYROIDAB,  in the last 72 hours ------------------------------------------------------------------------------------------------------------------ No results found for this basename: VITAMINB12, FOLATE, FERRITIN, TIBC, IRON, RETICCTPCT,  in the last 72 hours  Coagulation profile No results found for this basename: INR, PROTIME,  in the last 168 hours  No results found for this basename: DDIMER,  in the last 72 hours  Cardiac Enzymes  Recent Labs Lab 01/17/14 2221 01/18/14 0407  TROPONINI <0.30 <0.30   ------------------------------------------------------------------------------------------------------------------ No components found with this basename: POCBNP,      Time Spent in minutes   35   Clydean Posas K M.D  on 01/18/2014 at 11:43 AM  Between 7am to 7pm - Pager - [phone]  After 7pm go to [email or web address] - password [email or web address]  And look for the night coverage person covering for me after hours  [facility] Office  [phone]   **Disclaimer:  This note may have been dictated with voice recognition software. Similar sounding words can inadvertently be transcribed and this note may contain transcription errors which may not have been corrected upon publication of note.**

## 2014-01-18 NOTE — Progress Notes (Addendum)
PARENTERAL NUTRITION CONSULT NOTE - INITIAL  Pharmacy Consult for TPN Indication: PEG tube dysfunction  No Known Allergies  Patient Measurements: Height:  (157.5 cm) Weight: 188 lb 15 oz (85.7 kg) IBW/kg (Calculated) : 50.1 Adjusted Body Weight: 60.8 kg   Vital Signs: Temp: 97.8 F (36.6 C) (09/01 0800) Temp src: Axillary (09/01 0800) BP: 91/62 mmHg (09/01 0800) Pulse Rate: 77 (09/01 0800) Intake/Output from previous day:   Intake/Output from this shift: Total I/O In: -  Out: 100 [Urine:100]  Labs:  Recent Labs  01/17/14 1827 01/17/14 1836 01/18/14 0406  WBC 24.4*  --  18.4*  HGB 8.6* 9.2* 9.5*  HCT 26.2* 27.0* 28.4*  PLT 346  --  291     Recent Labs  01/17/14 1827 01/17/14 1836 01/18/14 0406 01/18/14 0407  NA  --  134* 137  --   K  --  2.9* 3.2*  --   CL  --  98 97  --   CO2  --   --  27  --   GLUCOSE  --  146* 124*  --   BUN  --  63* 73*  --   CREATININE  --  2.60* 2.44*  --   CALCIUM  --   --  7.9*  --   MG 2.5  --   --  2.5  PHOS  --   --   --  4.3  PROT  --   --  5.4*  --   ALBUMIN  --   --  1.1*  --   AST  --   --  17  --   ALT  --   --  7  --   ALKPHOS  --   --  133*  --   BILITOT  --   --  0.2*  --    Estimated Creatinine Clearance: 17.1 ml/min (by C-G formula based on Cr of 2.44).    Recent Labs  01/17/14 1627 01/18/14 0405 01/18/14 0813  GLUCAP 151* 142* 132*    Medical History: Past Medical History  Diagnosis Date  . Diabetes mellitus without complication   . Tracheostomy dependence   . Dysphagia   . Clostridium difficile diarrhea   . Gout   . Hypertension   . CVA (cerebral infarction)   . CHF (congestive heart failure)   . Anemia   . Depressed   . Neurogenic bladder   . Stroke   . STEMI (ST elevation myocardial infarction)      Insulin Requirements in the past 24 hours:  1 unit SSI  Current Nutrition:  No nutrition for past week per MD, NPO  Assessment: 80 year old AA female who was admitted from  [facility] after PEG tube became non-functional.  Pharmacy consulted to dose TPN for nutrition support.  Complex PMH.  GI: PEG tube malfunction; The PEG was placed by Dr. Hardin Negus on 12/28/13. Apparently, she has developed a lot of drainage and cellulitis around the feeding tube. There have been progress notes in their records from at least 8/24 regarding the feeding tube dysfunction.  Endocrine: pmh DM, on SSI. Was previously on levemir  Lytes: K 2.9 to 3.2 after 2 runs of K, mag 2.5, phos 4.3. Na 137  Renal: CKD, creat in low 2s; getting lasix; creat 2.44 today  Nutritional Goals:  Per unit RD  Access: CVC 8/31  TPN day # 0  (9/1>>     )   Plan:  Start clinimix E 5/15  at 42 ml/hr plus lipids at 10 ml/hr to provide 50 gm protein and 1196 kcals.  Stop IVF at 50 ml/hr when new TPN started.  3 runs of k for repletion, TPN labs in am Continue SSI, may need to add insulin to TPN with hx DM F/u RD for nutrition goals  Herby Abraham, Pharm.D. [phone] 01/18/2014 9:05 AM

## 2014-01-18 NOTE — Progress Notes (Addendum)
INITIAL NUTRITION ASSESSMENT  DOCUMENTATION CODES Per approved criteria  -Obesity Unspecified   INTERVENTION:  TPN per pharmacy RD to follow for nutrition care plan  NUTRITION DIAGNOSIS: Altered GI function as evidenced by malfunctioning PEG tube as evidenced by TPN initiation   Goal: Pt to meet >/= 90% of their estimated nutrition needs   Monitor:  TPN prescription, weight, labs, I/O's  Reason for Assessment: Consult  80 y.o. female  Admitting Dx: PEG tube malfunction  ASSESSMENT: 80 y.o. Female with PMH DM, trach, dysphagia, C diff, gout, HTN and CVA; admitted from [facility] after PEG tube became non-functional.  Patient is currently on ventilator support -- trach MV: 10 L/min Temp (24hrs), Avg:98.1 F (36.7 C), Min:97.3 F (36.3 C), Max:98.5 F (36.9 C)   Pt currently in CT-IMAGING.  Paperwork from [facility] reviewed.  RD unable to determine previous TF regimen.  RD consulted for new TPN initiation due to malfunctioning PEG tube.  Patient is receiving TPN with Clinimix E 5/15 @ 42 ml/hr and lipids @ 10 ml/hr. Provides 1195 kcal and 50 grams protein per day. Meets 75% minimum estimated energy needs and 55% minimum estimated protein needs.  Height: Ht Readings from Last 1 Encounters:  01/17/14  (1.575 m)    Weight: Wt Readings from Last 1 Encounters:  01/17/14 188 lb 15 oz (85.7 kg)    Ideal Body Weight: 110 lb  % Ideal Body Weight: 171%  Wt Readings from Last 10 Encounters:  01/17/14 188 lb 15 oz (85.7 kg)    Usual Body Weight: unable to obtain  % Usual Body Weight: ---  BMI:  Body mass index is 34.55 kg/(m^2).  Estimated Nutritional Needs: Kcal: 1579 Protein: 90-100 gm Fluid: per MD  Skin: Stage II pressure ulcer to left buttock  Diet Order: NPO  EDUCATION NEEDS: -No education needs identified at this time   Intake/Output Summary (Last 24 hours) at 01/18/14 1505 Last data filed at 01/18/14 0816  Gross per 24 hour  Intake      0 ml   Output    100 ml  Net   -100 ml    Labs:   Recent Labs Lab 01/17/14 1827 01/17/14 1836 01/18/14 0406 01/18/14 0407  NA  --  134* 137  --   K  --  2.9* 3.2*  --   CL  --  98 97  --   CO2  --   --  27  --   BUN  --  63* 73*  --   CREATININE  --  2.60* 2.44*  --   CALCIUM  --   --  7.9*  --   MG 2.5  --   --  2.5  PHOS  --   --   --  4.3  GLUCOSE  --  146* 124*  --     CBG (last 3)   Recent Labs  01/18/14 0405 01/18/14 0813 01/18/14 1157  GLUCAP 142* 132* 119*    Scheduled Meds: . chlorhexidine  15 mL Mouth/Throat BID  . [START ON 01/20/2014] DAPTOmycin (CUBICIN)  IV  500 mg Intravenous Q48H  . furosemide  40 mg Intravenous Daily  . heparin subcutaneous  5,000 Units Subcutaneous 3 times per day  . imipenem-cilastatin  250 mg Intravenous Q12H  . insulin aspart  0-9 Units Subcutaneous 6 times per day  . metoprolol  5 mg Intravenous Q6H  . pantoprazole (PROTONIX) IV  40 mg Intravenous Q24H  . potassium chloride  10 mEq Intravenous  Q1 Hr x 2    Continuous Infusions: . dextrose 5 % and 0.45% NaCl 50 mL/hr at 01/18/14 0313  . Marland KitchenTPN (CLINIMIX-E) Adult     And  . fat emulsion      Past Medical History  Diagnosis Date  . Diabetes mellitus without complication   . Tracheostomy dependence   . Dysphagia   . Clostridium difficile diarrhea   . Gout   . Hypertension   . CVA (cerebral infarction)   . CHF (congestive heart failure)   . Anemia   . Depressed   . Neurogenic bladder   . Stroke   . STEMI (ST elevation myocardial infarction)     Past Surgical History  Procedure Laterality Date  . Peg placement    . Tracheostomy      Maureen Chatters, RD, LDN Pager #: [phone] After-Hours Pager #: [phone]

## 2014-01-18 NOTE — Progress Notes (Signed)
UR Completed.  Tricia Reed [phone] 01/18/2014  

## 2014-01-18 NOTE — Consult Note (Signed)
WOC wound consult note Reason for Consult: evaluation of pressure ulcer and bilateral heel sDTI. Today at the time of my assessment she has intact heels with no evidence of DTI. She has some pink scaring that may indicate her heels have been open in the past.  I will add Prevalon boots for this reason since she is high risk  Wound type: left buttock: Stage II Pressure ulcer Pressure Ulcer POA: Yes Measurement: 1.5cm x 1.0cm x 0.2cm  Wound ZSW:FUXNAT, partial thickness skin loss with epithelial buds throughout the wound base Drainage (amount, consistency, odor) minimal Periwound:intact Dressing procedure/placement/frequency: Soft silicone foam to protect and insulate, change every 3 days. However she is soiled with stool today so if incontinence is a big issue would use only zinc based barrier cream to protect. Turn and reposition every 2 hours.   Discussed POC with patient and bedside nurse.  Re consult if needed, will not follow at this time. Thanks  Tricia Reed [employer], CWOCN [phone])

## 2014-01-19 DIAGNOSIS — J9610 Chronic respiratory failure, unspecified whether with hypoxia or hypercapnia: Secondary | ICD-10-CM

## 2014-01-19 DIAGNOSIS — Z515 Encounter for palliative care: Secondary | ICD-10-CM

## 2014-01-19 LAB — MAGNESIUM: Magnesium: 2.6 mg/dL — ABNORMAL HIGH (ref 1.5–2.5)

## 2014-01-19 LAB — GLUCOSE, CAPILLARY
GLUCOSE-CAPILLARY: 245 mg/dL — AB (ref 70–99)
GLUCOSE-CAPILLARY: 213 mg/dL — AB (ref 70–99)
Glucose-Capillary: 184 mg/dL — ABNORMAL HIGH (ref 70–99)
Glucose-Capillary: 190 mg/dL — ABNORMAL HIGH (ref 70–99)
Glucose-Capillary: 227 mg/dL — ABNORMAL HIGH (ref 70–99)
Glucose-Capillary: 228 mg/dL — ABNORMAL HIGH (ref 70–99)

## 2014-01-19 LAB — COMPREHENSIVE METABOLIC PANEL
ALT: <5 U/L (ref 0–35)
AST: 20 U/L (ref 0–37)
Albumin: 1.1 g/dL — ABNORMAL LOW (ref 3.5–5.2)
Alkaline Phosphatase: 117 U/L (ref 39–117)
Anion gap: 12 (ref 5–15)
BUN: 74 mg/dL — ABNORMAL HIGH (ref 6–23)
CALCIUM: 7.9 mg/dL — AB (ref 8.4–10.5)
CO2: 25 meq/L (ref 19–32)
Chloride: 99 mEq/L (ref 96–112)
Creatinine, Ser: 2.56 mg/dL — ABNORMAL HIGH (ref 0.50–1.10)
GFR calc non Af Amer: 16 mL/min — ABNORMAL LOW (ref >90)
GFR, EST AFRICAN AMERICAN: 19 mL/min — AB (ref >90)
GLUCOSE: 215 mg/dL — AB (ref 70–99)
POTASSIUM: 4 meq/L (ref 3.7–5.3)
Sodium: 136 mEq/L — ABNORMAL LOW (ref 137–147)
TOTAL PROTEIN: 5.5 g/dL — AB (ref 6.0–8.3)
Total Bilirubin: 0.2 mg/dL — ABNORMAL LOW (ref 0.3–1.2)

## 2014-01-19 LAB — PHOSPHORUS: PHOSPHORUS: 4.7 mg/dL — AB (ref 2.3–4.6)

## 2014-01-19 LAB — PREALBUMIN: PREALBUMIN: 3.2 mg/dL — AB (ref 17.0–34.0)

## 2014-01-19 LAB — TRIGLYCERIDES: TRIGLYCERIDES: 132 mg/dL (ref <150)

## 2014-01-19 MED ORDER — SILVER SULFADIAZINE 1 % EX CREA
TOPICAL_CREAM | Freq: Every day | CUTANEOUS | Status: DC
  Administered 2014-01-19: 12:00:00 via TOPICAL
  Administered 2014-01-20: 1 via TOPICAL
  Administered 2014-01-21 – 2014-01-25 (×5): via TOPICAL
  Filled 2014-01-19: qty 85

## 2014-01-19 MED ORDER — FAT EMULSION 20 % IV EMUL
250.0000 mL | INTRAVENOUS | Status: AC
  Administered 2014-01-19: 250 mL via INTRAVENOUS
  Filled 2014-01-19: qty 250

## 2014-01-19 MED ORDER — TRACE MINERALS CR-CU-F-FE-I-MN-MO-SE-ZN IV SOLN
INTRAVENOUS | Status: AC
  Administered 2014-01-19: 18:00:00 via INTRAVENOUS
  Filled 2014-01-19: qty 1000

## 2014-01-19 NOTE — Consult Note (Signed)
Patient Tricia Reed      DOB: 10-14-1926      HYQ:[id number]     Consult Note from the Palliative Medicine Team at [place] Requested by: Dr. Karleen Hampshire     PCP: No primary provider on file. Reason for Consultation: [place] and options    Phone Number:None  Assessment of patients Current state: I met today with Tricia Reed' son - Tricia Reed and his wife. We discussed Tricia Reed' history and how she came to be here in [place] and in this condition. Tricia Reed tells me that he understands her life is limited and that she will die and likely soon and wants "to make her transition as peaceful as possible." They tell me that she is a talker and very social and very independent living alone up until her MI that left her in this condition. They tell me that she would not want to be living like this. However, they also believe that "removing the ventilator is equivalent to holding a gun to her head" and believe they need to continue mechanical ventilation as needed. Unfortunately, with maintaining mechanical ventilation I do not know of any way to get her closer to home - discussed this with family (I believe [facility] is the closest location available - hopefully they will have a bed for her). They agree that we should not perform CPR/shock and they believe if this were needed and her heart were to stop that we should allow her to pass away as peaceful as we can. Tricia Reed tells me that he does not want her in pain and wants comfort a priority at all costs.   We also discussed PEG and Tricia Reed is accepting that surgery is not an option (but he felt he needed to know if anything could be done). He also does not feel we should put her through an increase in tube size and we should hold tube feedings for now considering risk vs benefit and quality of life. They tell me that she was eating at [facility] but that she seemed to decide to stop eating?? It seemed important to them that she be able to get some nutrition in  some way - proceed with TPN. Discussed removal of PEG but unable to make decision at this time. Tricia Reed enjoys reading and I have given him a copy of Hard Choices that I am hoping will help him with these decisions as well.   I will continue discussion with them around this issue but for now we will hold tube feedings and no further procedures for PEG desired. Tricia Reed wants his mother to be comfortable but there are many decisions and consequences to what this means to this family - I will have to continue discussions with them. I will communicate via telephone with them and they say they usually visit Wednesdays and Sundays but to call with any changes.     Goals of Care: 1.  Code Status: Ventilator only. NO CPR/shock/ACLS drugs with cardiac/respiratory distress.    2. Scope of Treatment: Continue mechanical ventilation. Continue treatment. Hold tube feedings and no further procedures for PEG replacement desired. Partial code - ventilator only.    4. Disposition: Hopeful for return to [facility].    3. Symptom Management:   1. Pain: Denies pain at this time. Consider low dose IV fentanyl as needed for pain control. Silvadene cream applied around PEG - has helped with pain.  2. Weakness: Continue ventilator and medical support.   4. Psychosocial: Emotional  support provided to patient and family.    Patient Documents Completed or Given: Document Given Completed  Advanced Directives Pkt    MOST    DNR    Gone from My [facility]ight    Hard Choices yes     Brief HPI: 80 yo female with complicated history of STEMI and CABG [facility] in May 2015. After CABG she was unable to be extubated long term and was given tracheostomy with the hopes of ventilator weaning but this has been unsuccessful. She also had an acute lacunar infarct during this time. She has been most recently at [facility] with multiple infections (C. Diff colitis, Klebsiella tracehitis, K. Oxytocia UTI) and now has cellulitis around a  leaking PEG tube. She is not a surgical candidate per Dr. Brantley Stage.    ROS: Denies pain, anxiety, sleep disturbance.     PMH:  Past Medical History  Diagnosis Date  . Diabetes mellitus without complication   . Tracheostomy dependence   . Dysphagia   . Clostridium difficile diarrhea   . Gout   . Hypertension   . CVA (cerebral infarction)   . CHF (congestive heart failure)   . Anemia   . Depressed   . Neurogenic bladder   . Stroke   . STEMI (ST elevation myocardial infarction)      PSH: Past Surgical History  Procedure Laterality Date  . Peg placement    . Tracheostomy     I have reviewed the FH and SH and  If appropriate update it with new information. No Known Allergies Scheduled Meds: . chlorhexidine  15 mL Mouth/Throat BID  . [START ON 01/20/2014] DAPTOmycin (CUBICIN)  IV  500 mg Intravenous Q48H  . furosemide  40 mg Intravenous Daily  . heparin subcutaneous  5,000 Units Subcutaneous 3 times per day  . imipenem-cilastatin  250 mg Intravenous Q12H  . insulin aspart  0-9 Units Subcutaneous 6 times per day  . metoprolol  5 mg Intravenous Q6H  . pantoprazole (PROTONIX) IV  40 mg Intravenous Q24H  . silver sulfADIAZINE   Topical Daily   Continuous Infusions: . Marland KitchenTPN (CLINIMIX-E) Adult 42 mL/hr at 01/18/14 1741   And  . fat emulsion 250 mL (01/18/14 1741)  . Marland KitchenTPN (CLINIMIX-E) Adult     And  . fat emulsion     PRN Meds:.    BP 108/59  Pulse 85  Temp(Src) 97.3 F (36.3 C) (Axillary)  Resp 27  Ht _0  (1.575 m)  Wt 85.7 kg (188 lb 15 oz)  BMI 34.55 kg/m2  SpO2 97%   PPS: 30%   Intake/Output Summary (Last 24 hours) at 01/19/14 1341 Last data filed at 01/19/14 1200  Gross per 24 hour  Intake 1502.47 ml  Output    200 ml  Net 1302.47 ml   LBM: 01/17/14                        Physical Exam:  General: NAD, chronically ill appearing HEENT: Tracheostomy connected to ventilator, no JVD Chest: CTA throughout, no labored breathing, symmetric CVS: RRR, S1  S2 Abdomen: Soft, NT, ND, PEG site with erythema/edema Ext: MAE, no edema, warm to touch Neuro: Awake, alert, answers yes/no questions appropriately  Labs: CBC    Component Value Date/Time   WBC 18.4* 01/18/2014 0406   RBC 3.41* 01/18/2014 0406   HGB 9.5* 01/18/2014 0406   HCT 28.4* 01/18/2014 0406   PLT 291 01/18/2014 0406   MCV 83.3 01/18/2014  0406   MCH 27.9 01/18/2014 0406   MCHC 33.5 01/18/2014 0406   RDW 16.8* 01/18/2014 0406   LYMPHSABS 1.7 01/17/2014 1827   MONOABS 1.0 01/17/2014 1827   EOSABS 0.2 01/17/2014 1827   BASOSABS 0.1 01/17/2014 1827    BMET    Component Value Date/Time   NA 136* 01/19/2014 0500   K 4.0 01/19/2014 0500   CL 99 01/19/2014 0500   CO2 25 01/19/2014 0500   GLUCOSE 215* 01/19/2014 0500   BUN 74* 01/19/2014 0500   CREATININE 2.56* 01/19/2014 0500   CALCIUM 7.9* 01/19/2014 0500   GFRNONAA 16* 01/19/2014 0500   GFRAA 19* 01/19/2014 0500    CMP     Component Value Date/Time   NA 136* 01/19/2014 0500   K 4.0 01/19/2014 0500   CL 99 01/19/2014 0500   CO2 25 01/19/2014 0500   GLUCOSE 215* 01/19/2014 0500   BUN 74* 01/19/2014 0500   CREATININE 2.56* 01/19/2014 0500   CALCIUM 7.9* 01/19/2014 0500   PROT 5.5* 01/19/2014 0500   ALBUMIN 1.1* 01/19/2014 0500   AST 20 01/19/2014 0500   ALT <5 01/19/2014 0500   ALKPHOS 117 01/19/2014 0500   BILITOT 0.2* 01/19/2014 0500   GFRNONAA 16* 01/19/2014 0500   GFRAA 19* 01/19/2014 0500     Time In Time Out Total Time Spent with Patient Total Overall Time  1245 1355 22mn 753m    Greater than 50%  of this time was spent counseling and coordinating care related to the above assessment and plan.  AlVinie SillNP Palliative Medicine Team Pager # [phone]-F 8a-5p) Team Phone # [phone]/Weekends)

## 2014-01-19 NOTE — Clinical Social Work Note (Signed)
CSW spoke with patients son and daughter in law at bedside.  Patients son, Emelin Dascenzo, confirmed that the only contact number to reach him is [phone], he also provided an email: [email or web address]   Merlyn Lot, LCSWA Clinical Social Worker [phone]

## 2014-01-19 NOTE — Clinical Social Work Placement (Signed)
Clinical Social Work Department BRIEF PSYCHOSOCIAL ASSESSMENT 01/19/2014  Patient:  Tricia Reed, Tricia Reed     Account Number:  [id number]     Admit date:  01/17/2014  Clinical Social Worker:  Merlyn Lot, CLINICAL SOCIAL WORKER  Date/Time:  01/19/2014 12:15 PM  Referred by:  Physician  Date Referred:  01/19/2014 Referred for  SNF Placement   Other Referral:   Interview type:  Family Other interview type:   patient also in room though did not speak    PSYCHOSOCIAL DATA Living Status:  FACILITY Admitted from facility:  Other Level of care:  Skilled Nursing Facility Primary support name:  Davia Smyre Primary support relationship to patient:  CHILD, ADULT Degree of support available:   Patients son and daughter in law at bedside and expressed high level of support for the patient.  Patients son and his wife drive from [place], [place] to [facility] twice a week to visit with the patient.    CURRENT CONCERNS Current Concerns  Post-Acute Placement   Other Concerns:   Patients son and wife wanted to speak with a doctor about medical issues with the patient, CSW informed nurse and nurse will page doctor to bedside.    SOCIAL WORK ASSESSMENT / PLAN CSW spoke with family about returned to [facility] after patient discharges from the hospital.  Patients family is agreeable to returning to [facility].  CSW explained that the hospital can not wait till a bed is availabe at [facility] but that [facility] would be our first choice for placement. CSW will continue to follow.   Assessment/plan status:  Psychosocial Support/Ongoing Assessment of Needs Other assessment/ plan:   FL2 update   Information/referral to community resources:   [facility]    PATIENT'S/FAMILY'S RESPONSE TO PLAN OF CARE: Patients family is agreeable to returning to [facility] and stated that [facility] has been better at getting the patient off the vent than any hospital the patient has been admitted to in the past.  Patients family also  very interested in returning to [facility] because of its location.       Merlyn Lot, LCSWA Clinical Social Worker [phone]

## 2014-01-19 NOTE — Progress Notes (Signed)
Subjective: PT AWAKE AND ON VENT.   Objective: Vital signs in last 24 hours: Temp:  [95.5 F (35.3 C)-97.6 F (36.4 C)] 97.3 F (36.3 C) (09/02 0747) Pulse Rate:  [45-98] 85 (09/02 0747) Resp:  [18-43] 27 (09/02 0747) BP: (108-154)/(55-90) 108/59 mmHg (09/02 0747) SpO2:  [94 %-100 %] 97 % (09/02 0747) FiO2 (%):  [30 %] 30 % (09/02 0835) Last BM Date: 01/17/14  Intake/Output from previous day: 09/01 0701 - 09/02 0700 In: 1590.5 [I.V.:550; IV Piggyback:400; TPN:640.5] Out: 300 [Urine:300] Intake/Output this shift:    Incision/Wound:PEG site with gastric secretions.  Skin burns noted;  macerated  And inflammed.  No pus or crepitence.   Lab Results:   Recent Labs  01/17/14 1827 01/17/14 1836 01/18/14 0406  WBC 24.4*  --  18.4*  HGB 8.6* 9.2* 9.5*  HCT 26.2* 27.0* 28.4*  PLT 346  --  291   BMET  Recent Labs  01/18/14 0406 01/19/14 0500  NA 137 136*  K 3.2* 4.0  CL 97 99  CO2 27 25  GLUCOSE 124* 215*  BUN 73* 74*  CREATININE 2.44* 2.56*  CALCIUM 7.9* 7.9*   PT/INR No results found for this basename: LABPROT, INR,  in the last 72 hours ABG No results found for this basename: PHART, PCO2, PO2, HCO3,  in the last 72 hours  Studies/Results: Ct Abdomen Wo Contrast  01/18/2014   CLINICAL DATA:  Evaluate PEG tube position abdominal wall.  EXAM: CT ABDOMEN WITHOUT CONTRAST  TECHNIQUE: Multidetector CT imaging of the abdomen was performed following the standard protocol without IV contrast.  COMPARISON:  None.  FINDINGS: There are moderate bilateral pleural effusions, incompletely visualized. Patchy opacities are partially visualized in the bilateral lower lobes, right middle lobe, and lingula, with evaluation partially limited by respiratory motion artifact. Coronary artery calcification is partially visualized. Mildly decreased attenuation of the blood pool is suggestive anemia.  There is a 1.2 cm hypodensity in the left hepatic lobe with associated calcification.  Multiple calcified gallstones are present. No biliary dilatation is identified. Calcification at the medial aspect of the spleen is favored to be vascular. The adrenal glands and pancreas are unremarkable. Right lower pole renal cyst measures 8.4 cm. Right renal parapelvic cyst measures 2.5 cm. 3 mm nonobstructing calculus is present in the upper pole of the left kidney. 2 mm nonobstructing stone versus vascular calcification is present in the left lower pole. Left renal interpolar cyst measures 4.3 cm.  A percutaneous gastrostomy tube is present. The balloon is inflated, with the tube in the distal gastric body. The stomach is apposed to the anterior abdominal wall at the gastrostomy site. There is evidence of skin/subcutaneous soft tissue ulceration at the gastrostomy site. No fluid collection is seen in this area. The visualized small and large bowel are nondilated.  No intraperitoneal free fluid or free air is seen in the abdomen. No enlarged lymph nodes are identified. Moderate atherosclerotic calcification is noted of the abdominal aorta and its major branch vessels. There is moderate, diffuse body wall edema. Sclerosis is noted in the right ilium adjacent to the sacroiliac joint. Multilevel thoracolumbar disc degeneration and lower lumbar facet arthrosis are noted.  IMPRESSION: 1. Gastrostomy tube in place in the distal stomach. Superficial soft tissue ulceration at the gastrostomy site. No fluid collection. 2. Cholelithiasis. 3. Anasarca and moderate bilateral pleural effusions. Partially visualized lung opacities, which could reflect edema or multifocal infection. 4. Small, partially calcified low-density liver lesion. This is nonspecific and favored to  be benign, such as a cyst related to prior infection or hemangioma, although a metastatic or primary malignant lesion cannot be completely excluded. Further evaluation with contrast-enhanced abdominal MRI could be performed on an elective basis as clinically  warranted based on patient's other comorbidities.   Electronically Signed   By: Sebastian Ache   On: 01/18/2014 14:57   Dg Chest Port 1 View  01/18/2014   CLINICAL DATA:  line placement  EXAM: PORTABLE CHEST - 1 VIEW  COMPARISON:  01/17/2014  FINDINGS: Right ij central line has been removed and a new left ij central line placed to the distal svc. no pneumothorax. tracheostomy stable. previous median sternotomy. moderate diffuse interstitial and airspace opacities throughout both lungs, involving bases more than apices, perhaps marginally improved since previous exam. Suspect small pleural effusions. Heart size upper limits normal.  Degenerative changes in bilateral shoulders. Spurring in the thoracic spine.  IMPRESSION: 1. Central line to the distal SVC without pneumothorax. 2. Moderate bilateral interstitial and airspace opacities, perhaps marginally improved since previous film.   Electronically Signed   By: Oley Balm M.D.   On: 01/18/2014 00:57   Dg Chest Portable 1 View  01/17/2014   CLINICAL DATA:  Central line placement  EXAM: PORTABLE CHEST - 1 VIEW  COMPARISON:  None.  FINDINGS: Right internal jugular central line tip identified projecting over superior vena cava at the level of the azygos arch, approximately 1.5 cm above the cavoatrial junction. No pneumothorax. Tracheostomy tube projects over the tracheal air column.  Heart size normal. There is extensive bilateral central hazy opacity. There is calcification of the aortic arch.  IMPRESSION: Central line as described. Extensive bilateral airspace disease possibly representing pulmonary edema.   Electronically Signed   By: Esperanza Heir M.D.   On: 01/17/2014 19:12    Anti-infectives: Anti-infectives   Start     Dose/Rate Route Frequency Ordered Stop   01/20/14 1000  DAPTOmycin (CUBICIN) 500 mg in sodium chloride 0.9 % IVPB     500 mg 220 mL/hr over 30 Minutes Intravenous Every 48 hours 01/18/14 1450     01/18/14 2200  ceFEPIme (MAXIPIME)  1 g in dextrose 5 % 50 mL IVPB  Status:  Discontinued     1 g 100 mL/hr over 30 Minutes Intravenous Every 24 hours 01/17/14 2103 01/17/14 2108   01/18/14 1000  DAPTOmycin (CUBICIN) 500 mg in sodium chloride 0.9 % IVPB  Status:  Discontinued     500 mg 220 mL/hr over 30 Minutes Intravenous Every 24 hours 01/18/14 0904 01/18/14 1450   01/18/14 0000  fluconazole (DIFLUCAN) IVPB 200 mg  Status:  Discontinued     200 mg 100 mL/hr over 60 Minutes Intravenous Every 24 hours 01/17/14 2128 01/18/14 1146   01/17/14 2200  imipenem-cilastatin (PRIMAXIN) 500 mg in sodium chloride 0.9 % 100 mL IVPB  Status:  Discontinued     500 mg 200 mL/hr over 30 Minutes Intravenous 3 times per day 01/17/14 2107 01/17/14 2111   01/17/14 2130  imipenem-cilastatin (PRIMAXIN) 250 mg in sodium chloride 0.9 % 100 mL IVPB     250 mg 200 mL/hr over 30 Minutes Intravenous Every 12 hours 01/17/14 2112     01/17/14 2115  ceFEPIme (MAXIPIME) 2 g in dextrose 5 % 50 mL IVPB  Status:  Discontinued     2 g 100 mL/hr over 30 Minutes Intravenous  Once 01/17/14 2103 01/17/14 2108      Assessment/Plan: PEG site excoriation/ inflammation. Add silvadene to site  since acid burning skin.  On protonix.  CT shows tube in the stomach.   Not much can be done until she is 4 weeks out. Currently 3 weeks out and doubt stomach stuck up yet. Removal and allowing site to heal best course of action.  Not a surgical candidate /  Would not tolerate general anesthesia. May need to have IR see if they can up size to larger tube to help prevent leakage.  Palliative care /  End of life care recommended.     LOS: 2 days    Beryl Hornberger A. 01/19/2014

## 2014-01-19 NOTE — Progress Notes (Signed)
[facility] Progress Note    Subjective: Patient is alert on the ventilator, no distress until we started manipulating her G-tube.  Objective: Vital signs in last 24 hours: Temp:  [95.5 F (35.3 C)-97.6 F (36.4 C)] 97.3 F (36.3 C) (09/02 0747) Pulse Rate:  [45-98] 85 (09/02 0747) Resp:  [18-43] 27 (09/02 0747) BP: (108-154)/(55-90) 108/59 mmHg (09/02 0747) SpO2:  [94 %-100 %] 97 % (09/02 0747) FiO2 (%):  [30 %] 30 % (09/02 0400) Last BM Date: 01/17/14  Intake/Output from previous day: 09/01 0701 - 09/02 0700 In: 1590.5 [I.V.:550; IV Piggyback:400; TPN:640.5] Out: 300 [Urine:300] Intake/Output this shift:    General: No acute distress at rest.  Lungs: Clear on the ventilator.  CXR shows large bilateral effusions  Abd: G-tube sites looks horrible.  Eroded and leaking sputum and bilious fluid.  Not being used for feedings.  Extremities: No changes  Neuro: Intact  Lab Results:  (wbc:2,hgb:2,hct:2,plt:2) BMET  Recent Labs  01/18/14 0406 01/19/14 0500  NA 137 136*  K 3.2* 4.0  CL 97 99  CO2 27 25  GLUCOSE 124* 215*  BUN 73* 74*  CREATININE 2.44* 2.56*  CALCIUM 7.9* 7.9*   PT/INR No results found for this basename: LABPROT, INR,  in the last 72 hours ABG No results found for this basename: PHART, PCO2, PO2, HCO3,  in the last 72 hours  Studies/Results: Ct Abdomen Wo Contrast  01/18/2014   CLINICAL DATA:  Evaluate PEG tube position abdominal wall.  EXAM: CT ABDOMEN WITHOUT CONTRAST  TECHNIQUE: Multidetector CT imaging of the abdomen was performed following the standard protocol without IV contrast.  COMPARISON:  None.  FINDINGS: There are moderate bilateral pleural effusions, incompletely visualized. Patchy opacities are partially visualized in the bilateral lower lobes, right middle lobe, and lingula, with evaluation partially limited by respiratory motion artifact. Coronary artery calcification is partially visualized. Mildly decreased attenuation of the  blood pool is suggestive anemia.  There is a 1.2 cm hypodensity in the left hepatic lobe with associated calcification. Multiple calcified gallstones are present. No biliary dilatation is identified. Calcification at the medial aspect of the spleen is favored to be vascular. The adrenal glands and pancreas are unremarkable. Right lower pole renal cyst measures 8.4 cm. Right renal parapelvic cyst measures 2.5 cm. 3 mm nonobstructing calculus is present in the upper pole of the left kidney. 2 mm nonobstructing stone versus vascular calcification is present in the left lower pole. Left renal interpolar cyst measures 4.3 cm.  A percutaneous gastrostomy tube is present. The balloon is inflated, with the tube in the distal gastric body. The stomach is apposed to the anterior abdominal wall at the gastrostomy site. There is evidence of skin/subcutaneous soft tissue ulceration at the gastrostomy site. No fluid collection is seen in this area. The visualized small and large bowel are nondilated.  No intraperitoneal free fluid or free air is seen in the abdomen. No enlarged lymph nodes are identified. Moderate atherosclerotic calcification is noted of the abdominal aorta and its major branch vessels. There is moderate, diffuse body wall edema. Sclerosis is noted in the right ilium adjacent to the sacroiliac joint. Multilevel thoracolumbar disc degeneration and lower lumbar facet arthrosis are noted.  IMPRESSION: 1. Gastrostomy tube in place in the distal stomach. Superficial soft tissue ulceration at the gastrostomy site. No fluid collection. 2. Cholelithiasis. 3. Anasarca and moderate bilateral pleural effusions. Partially visualized lung opacities, which could reflect edema or multifocal infection. 4. Small, partially calcified low-density liver lesion. This is  nonspecific and favored to be benign, such as a cyst related to prior infection or hemangioma, although a metastatic or primary malignant lesion cannot be completely  excluded. Further evaluation with contrast-enhanced abdominal MRI could be performed on an elective basis as clinically warranted based on patient's other comorbidities.   Electronically Signed   By: Sebastian Ache   On: 01/18/2014 14:57   Dg Chest Port 1 View  01/18/2014   CLINICAL DATA:  line placement  EXAM: PORTABLE CHEST - 1 VIEW  COMPARISON:  01/17/2014  FINDINGS: Right ij central line has been removed and a new left ij central line placed to the distal svc. no pneumothorax. tracheostomy stable. previous median sternotomy. moderate diffuse interstitial and airspace opacities throughout both lungs, involving bases more than apices, perhaps marginally improved since previous exam. Suspect small pleural effusions. Heart size upper limits normal.  Degenerative changes in bilateral shoulders. Spurring in the thoracic spine.  IMPRESSION: 1. Central line to the distal SVC without pneumothorax. 2. Moderate bilateral interstitial and airspace opacities, perhaps marginally improved since previous film.   Electronically Signed   By: Oley Balm M.D.   On: 01/18/2014 00:57   Dg Chest Portable 1 View  01/17/2014   CLINICAL DATA:  Central line placement  EXAM: PORTABLE CHEST - 1 VIEW  COMPARISON:  None.  FINDINGS: Right internal jugular central line tip identified projecting over superior vena cava at the level of the azygos arch, approximately 1.5 cm above the cavoatrial junction. No pneumothorax. Tracheostomy tube projects over the tracheal air column.  Heart size normal. There is extensive bilateral central hazy opacity. There is calcification of the aortic arch.  IMPRESSION: Central line as described. Extensive bilateral airspace disease possibly representing pulmonary edema.   Electronically Signed   By: Esperanza Heir M.D.   On: 01/17/2014 19:12    Anti-infectives: Anti-infectives   Start     Dose/Rate Route Frequency Ordered Stop   01/20/14 1000  DAPTOmycin (CUBICIN) 500 mg in sodium chloride 0.9 % IVPB      500 mg 220 mL/hr over 30 Minutes Intravenous Every 48 hours 01/18/14 1450     01/18/14 2200  ceFEPIme (MAXIPIME) 1 g in dextrose 5 % 50 mL IVPB  Status:  Discontinued     1 g 100 mL/hr over 30 Minutes Intravenous Every 24 hours 01/17/14 2103 01/17/14 2108   01/18/14 1000  DAPTOmycin (CUBICIN) 500 mg in sodium chloride 0.9 % IVPB  Status:  Discontinued     500 mg 220 mL/hr over 30 Minutes Intravenous Every 24 hours 01/18/14 0904 01/18/14 1450   01/18/14 0000  fluconazole (DIFLUCAN) IVPB 200 mg  Status:  Discontinued     200 mg 100 mL/hr over 60 Minutes Intravenous Every 24 hours 01/17/14 2128 01/18/14 1146   01/17/14 2200  imipenem-cilastatin (PRIMAXIN) 500 mg in sodium chloride 0.9 % 100 mL IVPB  Status:  Discontinued     500 mg 200 mL/hr over 30 Minutes Intravenous 3 times per day 01/17/14 2107 01/17/14 2111   01/17/14 2130  imipenem-cilastatin (PRIMAXIN) 250 mg in sodium chloride 0.9 % 100 mL IVPB     250 mg 200 mL/hr over 30 Minutes Intravenous Every 12 hours 01/17/14 2112     01/17/14 2115  ceFEPIme (MAXIPIME) 2 g in dextrose 5 % 50 mL IVPB  Status:  Discontinued     2 g 100 mL/hr over 30 Minutes Intravenous  Once 01/17/14 2103 01/17/14 2108      Assessment/Plan: s/p  Some discussion  needs to be had with the patient about long term outlook.   To correct this problem with the G-tube would require major surgery which I do not believe that the patient would tolerate, and even if she got through the surgery, postop complications would be significant.  LOS: 2 days   Marta Lamas. Gae Bon, MD, FACS [phone] [id number] [facility] 01/19/2014

## 2014-01-19 NOTE — Progress Notes (Signed)
INFECTIOUS DISEASE PROGRESS NOTE  ID: Tricia Reed is a 80 y.o. female with  Principal Problem:   PEG tube malfunction Active Problems:   Chronic respiratory failure   Tracheostomy in place   Ventilator dependent   Sepsis   CKD (chronic kidney disease)   Hypokalemia   Anemia   Acute on chronic respiratory failure  Subjective: Comfortable in bed.   Abtx:  Anti-infectives   Start     Dose/Rate Route Frequency Ordered Stop   01/20/14 1000  DAPTOmycin (CUBICIN) 500 mg in sodium chloride 0.9 % IVPB     500 mg 220 mL/hr over 30 Minutes Intravenous Every 48 hours 01/18/14 1450     01/18/14 2200  ceFEPIme (MAXIPIME) 1 g in dextrose 5 % 50 mL IVPB  Status:  Discontinued     1 g 100 mL/hr over 30 Minutes Intravenous Every 24 hours 01/17/14 2103 01/17/14 2108   01/18/14 1000  DAPTOmycin (CUBICIN) 500 mg in sodium chloride 0.9 % IVPB  Status:  Discontinued     500 mg 220 mL/hr over 30 Minutes Intravenous Every 24 hours 01/18/14 0904 01/18/14 1450   01/18/14 0000  fluconazole (DIFLUCAN) IVPB 200 mg  Status:  Discontinued     200 mg 100 mL/hr over 60 Minutes Intravenous Every 24 hours 01/17/14 2128 01/18/14 1146   01/17/14 2200  imipenem-cilastatin (PRIMAXIN) 500 mg in sodium chloride 0.9 % 100 mL IVPB  Status:  Discontinued     500 mg 200 mL/hr over 30 Minutes Intravenous 3 times per day 01/17/14 2107 01/17/14 2111   01/17/14 2130  imipenem-cilastatin (PRIMAXIN) 250 mg in sodium chloride 0.9 % 100 mL IVPB     250 mg 200 mL/hr over 30 Minutes Intravenous Every 12 hours 01/17/14 2112     01/17/14 2115  ceFEPIme (MAXIPIME) 2 g in dextrose 5 % 50 mL IVPB  Status:  Discontinued     2 g 100 mL/hr over 30 Minutes Intravenous  Once 01/17/14 2103 01/17/14 2108      Medications:  Scheduled: . chlorhexidine  15 mL Mouth/Throat BID  . [START ON 01/20/2014] DAPTOmycin (CUBICIN)  IV  500 mg Intravenous Q48H  . furosemide  40 mg Intravenous Daily  . heparin subcutaneous  5,000 Units  Subcutaneous 3 times per day  . imipenem-cilastatin  250 mg Intravenous Q12H  . insulin aspart  0-9 Units Subcutaneous 6 times per day  . metoprolol  5 mg Intravenous Q6H  . pantoprazole (PROTONIX) IV  40 mg Intravenous Q24H  . silver sulfADIAZINE   Topical Daily    Objective: Vital signs in last 24 hours: Temp:  [95.5 F (35.3 C)-97.6 F (36.4 C)] 97.3 F (36.3 C) (09/02 0747) Pulse Rate:  [45-98] 85 (09/02 0747) Resp:  [20-43] 27 (09/02 0747) BP: (108-154)/(55-90) 108/59 mmHg (09/02 0747) SpO2:  [94 %-100 %] 97 % (09/02 0747) FiO2 (%):  [30 %] 30 % (09/02 1236)   General appearance: alert, cooperative and no distress Resp: clear to auscultation bilaterally Cardio: regular rate and rhythm GI: normal findings: bowel sounds normal and soft and abnormal findings:  tender at PEG site.   Lab Results  Recent Labs  01/17/14 1827  01/17/14 1836 01/18/14 0406 01/19/14 0500  WBC 24.4*  --   --  18.4*  --   HGB 8.6*  --  9.2* 9.5*  --   HCT 26.2*  --  27.0* 28.4*  --   NA  --   < > 134* 137 136*  K  --   < >  2.9* 3.2* 4.0  CL  --   < > 98 97 99  CO2  --   --   --  27 25  BUN  --   < > 63* 73* 74*  CREATININE  --   < > 2.60* 2.44* 2.56*  < > = values in this interval not displayed. Liver Panel  Recent Labs  01/18/14 0406 01/19/14 0500  PROT 5.4* 5.5*  ALBUMIN 1.1* 1.1*  AST 17 20  ALT 7 <5  ALKPHOS 133* 117  BILITOT 0.2* 0.2*   Sedimentation Rate No results found for this basename: ESRSEDRATE,  in the last 72 hours C-Reactive Protein No results found for this basename: CRP,  in the last 72 hours  Microbiology: Recent Results (from the past 240 hour(s))  MRSA PCR SCREENING     Status: None   Collection Time    01/17/14 11:08 PM      Result Value Ref Range Status   MRSA by PCR NEGATIVE  NEGATIVE Final   Comment:            The GeneXpert MRSA Assay (FDA     approved for NASAL specimens     only), is one component of a     comprehensive MRSA colonization      surveillance program. It is not     intended to diagnose MRSA     infection nor to guide or     monitor treatment for     MRSA infections.  CLOSTRIDIUM DIFFICILE BY PCR     Status: None   Collection Time    01/18/14  3:04 PM      Result Value Ref Range Status   C difficile by pcr NEGATIVE  NEGATIVE Final    Studies/Results: Ct Abdomen Wo Contrast  01/18/2014   CLINICAL DATA:  Evaluate PEG tube position abdominal wall.  EXAM: CT ABDOMEN WITHOUT CONTRAST  TECHNIQUE: Multidetector CT imaging of the abdomen was performed following the standard protocol without IV contrast.  COMPARISON:  None.  FINDINGS: There are moderate bilateral pleural effusions, incompletely visualized. Patchy opacities are partially visualized in the bilateral lower lobes, right middle lobe, and lingula, with evaluation partially limited by respiratory motion artifact. Coronary artery calcification is partially visualized. Mildly decreased attenuation of the blood pool is suggestive anemia.  There is a 1.2 cm hypodensity in the left hepatic lobe with associated calcification. Multiple calcified gallstones are present. No biliary dilatation is identified. Calcification at the medial aspect of the spleen is favored to be vascular. The adrenal glands and pancreas are unremarkable. Right lower pole renal cyst measures 8.4 cm. Right renal parapelvic cyst measures 2.5 cm. 3 mm nonobstructing calculus is present in the upper pole of the left kidney. 2 mm nonobstructing stone versus vascular calcification is present in the left lower pole. Left renal interpolar cyst measures 4.3 cm.  A percutaneous gastrostomy tube is present. The balloon is inflated, with the tube in the distal gastric body. The stomach is apposed to the anterior abdominal wall at the gastrostomy site. There is evidence of skin/subcutaneous soft tissue ulceration at the gastrostomy site. No fluid collection is seen in this area. The visualized small and large bowel are  nondilated.  No intraperitoneal free fluid or free air is seen in the abdomen. No enlarged lymph nodes are identified. Moderate atherosclerotic calcification is noted of the abdominal aorta and its major branch vessels. There is moderate, diffuse body wall edema. Sclerosis is noted in the right ilium adjacent  to the sacroiliac joint. Multilevel thoracolumbar disc degeneration and lower lumbar facet arthrosis are noted.  IMPRESSION: 1. Gastrostomy tube in place in the distal stomach. Superficial soft tissue ulceration at the gastrostomy site. No fluid collection. 2. Cholelithiasis. 3. Anasarca and moderate bilateral pleural effusions. Partially visualized lung opacities, which could reflect edema or multifocal infection. 4. Small, partially calcified low-density liver lesion. This is nonspecific and favored to be benign, such as a cyst related to prior infection or hemangioma, although a metastatic or primary malignant lesion cannot be completely excluded. Further evaluation with contrast-enhanced abdominal MRI could be performed on an elective basis as clinically warranted based on patient's other comorbidities.   Electronically Signed   By: Sebastian Ache   On: 01/18/2014 14:57   Dg Chest Port 1 View  01/18/2014   CLINICAL DATA:  line placement  EXAM: PORTABLE CHEST - 1 VIEW  COMPARISON:  01/17/2014  FINDINGS: Right ij central line has been removed and a new left ij central line placed to the distal svc. no pneumothorax. tracheostomy stable. previous median sternotomy. moderate diffuse interstitial and airspace opacities throughout both lungs, involving bases more than apices, perhaps marginally improved since previous exam. Suspect small pleural effusions. Heart size upper limits normal.  Degenerative changes in bilateral shoulders. Spurring in the thoracic spine.  IMPRESSION: 1. Central line to the distal SVC without pneumothorax. 2. Moderate bilateral interstitial and airspace opacities, perhaps marginally  improved since previous film.   Electronically Signed   By: Oley Balm M.D.   On: 01/18/2014 00:57   Dg Chest Portable 1 View  01/17/2014   CLINICAL DATA:  Central line placement  EXAM: PORTABLE CHEST - 1 VIEW  COMPARISON:  None.  FINDINGS: Right internal jugular central line tip identified projecting over superior vena cava at the level of the azygos arch, approximately 1.5 cm above the cavoatrial junction. No pneumothorax. Tracheostomy tube projects over the tracheal air column.  Heart size normal. There is extensive bilateral central hazy opacity. There is calcification of the aortic arch.  IMPRESSION: Central line as described. Extensive bilateral airspace disease possibly representing pulmonary edema.   Electronically Signed   By: Esperanza Heir M.D.   On: 01/17/2014 19:12     Assessment/Plan: PEG cellulitis  ARF DM  Ischemic Colitis  Decubitus Ulcer  Previous Funguria  Previous UTI (k oxytoca- multiple drug resistant)  Severe protien calorie malunutrition (alb 1.1)  Total days of antibiotics:  Dapto/imipenem (day 3 of hospital)  Would plan for 7 days of her current rx for "cellulitis" Agree with palliative care eval Available if questions         Johny Sax Infectious Diseases (pager) [phone] [email or web address] 01/19/2014, 1:49 PM  LOS: 2 days

## 2014-01-19 NOTE — Progress Notes (Signed)
[facility] PROGRESS NOTE  Tricia Reed ZOX:[id number] DOB: 1926/09/18 DOA: 01/17/2014 PCP: No primary provider on file.  Assessment/Plan: Cellulitis from the PEG tube site with continuous leak: general surgery on board, patient is not a surgical candidate. No tube feeds. On IV antibiotics for the cellulitis around the peg tube.  Mild to moderate malnutrition on TNA. Palliative care consulted and recommendations given. SLP consulted.  Acute renal failure: baseline around 2. Her creatinine is 2.56 with bUN of 74.   Chronic respiratory failure: vent dependent from [facility]. Pulmonary consulted for vent management.     Code Status: Partial CODE.  Family Communication: discussed with son over the phone.  Disposition Plan: pending.    Consultants:  Pulmonary  ID  Surgery.   Procedures:  CT abdomen and pelvis  Left subclavian central line 9/1  CXR  Antibiotics:  cubicin  Fluconazole  Primaxin.   HPI/Subjective: denies any new complaints.  Objective: Filed Vitals:   01/19/14 1236  BP:   Pulse:   Temp: 98.5 F (36.9 C)  Resp:     Intake/Output Summary (Last 24 hours) at 01/19/14 1530 Last data filed at 01/19/14 1458  Gross per 24 hour  Intake 1406.47 ml  Output    400 ml  Net 1006.47 ml   Filed Weights   01/17/14 2200  Weight: 85.7 kg (188 lb 15 oz)    Exam:   General:  Alert afebrile comfortable  Cardiovascular: s1s2  Respiratory: diminished air entry at bases.   Abdomen: soft, PEG in place with surrounding white based ulcers and erythema.   Musculoskeletal: no edema or cyanosis.   Data Reviewed: Basic Metabolic Panel:  Recent Labs Lab 01/17/14 1827 01/17/14 1836 01/18/14 0406 01/18/14 0407 01/19/14 0500  NA  --  134* 137  --  136*  K  --  2.9* 3.2*  --  4.0  CL  --  98 97  --  99  CO2  --   --  27  --  25  GLUCOSE  --  146* 124*  --  215*  BUN  --  63* 73*  --  74*  CREATININE  --  2.60* 2.44*  --  2.56*  CALCIUM   --   --  7.9*  --  7.9*  MG 2.5  --   --  2.5 2.6*  PHOS  --   --   --  4.3 4.7*   Liver Function Tests:  Recent Labs Lab 01/18/14 0406 01/19/14 0500  AST 17 20  ALT 7 <5  ALKPHOS 133* 117  BILITOT 0.2* 0.2*  PROT 5.4* 5.5*  ALBUMIN 1.1* 1.1*   No results found for this basename: LIPASE, AMYLASE,  in the last 168 hours No results found for this basename: AMMONIA,  in the last 168 hours CBC:  Recent Labs Lab 01/17/14 1827 01/17/14 1836 01/18/14 0406  WBC 24.4*  --  18.4*  NEUTROABS 21.4*  --   --   HGB 8.6* 9.2* 9.5*  HCT 26.2* 27.0* 28.4*  MCV 83.4  --  83.3  PLT 346  --  291   Cardiac Enzymes:  Recent Labs Lab 01/17/14 1827 01/17/14 2221 01/18/14 0407 01/18/14 1345  CKTOTAL 32  --   --  47  TROPONINI  --  <0.30 <0.30 <0.30   BNP (last 3 results)  Recent Labs  01/18/14 0407  PROBNP 17040.0*   CBG:  Recent Labs Lab 01/18/14 2008 01/19/14 0026 01/19/14 0431 01/19/14 0753 01/19/14 1125  GLUCAP  171* 184* 227* 245* 228*    Recent Results (from the past 240 hour(s))  MRSA PCR SCREENING     Status: None   Collection Time    01/17/14 11:08 PM      Result Value Ref Range Status   MRSA by PCR NEGATIVE  NEGATIVE Final   Comment:            The GeneXpert MRSA Assay (FDA     approved for NASAL specimens     only), is one component of a     comprehensive MRSA colonization     surveillance program. It is not     intended to diagnose MRSA     infection nor to guide or     monitor treatment for     MRSA infections.  CLOSTRIDIUM DIFFICILE BY PCR     Status: None   Collection Time    17-Feb-2014  3:04 PM      Result Value Ref Range Status   C difficile by pcr NEGATIVE  NEGATIVE Final     Studies: Ct Abdomen Wo Contrast  02-17-14   CLINICAL DATA:  Evaluate PEG tube position abdominal wall.  EXAM: CT ABDOMEN WITHOUT CONTRAST  TECHNIQUE: Multidetector CT imaging of the abdomen was performed following the standard protocol without IV contrast.   COMPARISON:  None.  FINDINGS: There are moderate bilateral pleural effusions, incompletely visualized. Patchy opacities are partially visualized in the bilateral lower lobes, right middle lobe, and lingula, with evaluation partially limited by respiratory motion artifact. Coronary artery calcification is partially visualized. Mildly decreased attenuation of the blood pool is suggestive anemia.  There is a 1.2 cm hypodensity in the left hepatic lobe with associated calcification. Multiple calcified gallstones are present. No biliary dilatation is identified. Calcification at the medial aspect of the spleen is favored to be vascular. The adrenal glands and pancreas are unremarkable. Right lower pole renal cyst measures 8.4 cm. Right renal parapelvic cyst measures 2.5 cm. 3 mm nonobstructing calculus is present in the upper pole of the left kidney. 2 mm nonobstructing stone versus vascular calcification is present in the left lower pole. Left renal interpolar cyst measures 4.3 cm.  A percutaneous gastrostomy tube is present. The balloon is inflated, with the tube in the distal gastric body. The stomach is apposed to the anterior abdominal wall at the gastrostomy site. There is evidence of skin/subcutaneous soft tissue ulceration at the gastrostomy site. No fluid collection is seen in this area. The visualized small and large bowel are nondilated.  No intraperitoneal free fluid or free air is seen in the abdomen. No enlarged lymph nodes are identified. Moderate atherosclerotic calcification is noted of the abdominal aorta and its major branch vessels. There is moderate, diffuse body wall edema. Sclerosis is noted in the right ilium adjacent to the sacroiliac joint. Multilevel thoracolumbar disc degeneration and lower lumbar facet arthrosis are noted.  IMPRESSION: 1. Gastrostomy tube in place in the distal stomach. Superficial soft tissue ulceration at the gastrostomy site. No fluid collection. 2. Cholelithiasis. 3.  Anasarca and moderate bilateral pleural effusions. Partially visualized lung opacities, which could reflect edema or multifocal infection. 4. Small, partially calcified low-density liver lesion. This is nonspecific and favored to be benign, such as a cyst related to prior infection or hemangioma, although a metastatic or primary malignant lesion cannot be completely excluded. Further evaluation with contrast-enhanced abdominal MRI could be performed on an elective basis as clinically warranted based on patient's other comorbidities.   Electronically Signed  By: Sebastian Ache   On: 01/18/2014 14:57   Dg Chest Port 1 View  01/18/2014   CLINICAL DATA:  line placement  EXAM: PORTABLE CHEST - 1 VIEW  COMPARISON:  01/17/2014  FINDINGS: Right ij central line has been removed and a new left ij central line placed to the distal svc. no pneumothorax. tracheostomy stable. previous median sternotomy. moderate diffuse interstitial and airspace opacities throughout both lungs, involving bases more than apices, perhaps marginally improved since previous exam. Suspect small pleural effusions. Heart size upper limits normal.  Degenerative changes in bilateral shoulders. Spurring in the thoracic spine.  IMPRESSION: 1. Central line to the distal SVC without pneumothorax. 2. Moderate bilateral interstitial and airspace opacities, perhaps marginally improved since previous film.   Electronically Signed   By: Oley Balm M.D.   On: 01/18/2014 00:57   Dg Chest Portable 1 View  01/17/2014   CLINICAL DATA:  Central line placement  EXAM: PORTABLE CHEST - 1 VIEW  COMPARISON:  None.  FINDINGS: Right internal jugular central line tip identified projecting over superior vena cava at the level of the azygos arch, approximately 1.5 cm above the cavoatrial junction. No pneumothorax. Tracheostomy tube projects over the tracheal air column.  Heart size normal. There is extensive bilateral central hazy opacity. There is calcification of the  aortic arch.  IMPRESSION: Central line as described. Extensive bilateral airspace disease possibly representing pulmonary edema.   Electronically Signed   By: Esperanza Heir M.D.   On: 01/17/2014 19:12    Scheduled Meds: . chlorhexidine  15 mL Mouth/Throat BID  . [START ON 01/20/2014] DAPTOmycin (CUBICIN)  IV  500 mg Intravenous Q48H  . furosemide  40 mg Intravenous Daily  . heparin subcutaneous  5,000 Units Subcutaneous 3 times per day  . imipenem-cilastatin  250 mg Intravenous Q12H  . insulin aspart  0-9 Units Subcutaneous 6 times per day  . metoprolol  5 mg Intravenous Q6H  . pantoprazole (PROTONIX) IV  40 mg Intravenous Q24H  . silver sulfADIAZINE   Topical Daily   Continuous Infusions: . Marland KitchenTPN (CLINIMIX-E) Adult 42 mL/hr at 01/18/14 1741   And  . fat emulsion 250 mL (01/18/14 1741)  . Marland KitchenTPN (CLINIMIX-E) Adult     And  . fat emulsion      Principal Problem:   PEG tube malfunction Active Problems:   Chronic respiratory failure   Tracheostomy in place   Ventilator dependent   Sepsis   CKD (chronic kidney disease)   Hypokalemia   Anemia   Acute on chronic respiratory failure   Palliative care encounter    Time spent: 35 minutes.     [facility], [place]  [facility] Pager [phone] If 7PM-7AM, please contact night-coverage at [email or web address], password [facility] 01/19/2014, 3:30 PM  LOS: 2 days

## 2014-01-19 NOTE — Progress Notes (Signed)
PARENTERAL NUTRITION CONSULT NOTE - FOLLOW UP  Pharmacy Consult:  TPN Indication:  PEG tube dysfunction  No Known Allergies  Patient Measurements: Height:  (157.5 cm) Weight: 188 lb 15 oz (85.7 kg) IBW/kg (Calculated) : 50.1  Vital Signs: Temp: 97.3 F (36.3 C) (09/02 0747) Temp src: Axillary (09/02 0747) BP: 108/59 mmHg (09/02 0747) Pulse Rate: 85 (09/02 0747) Intake/Output from previous day: 09/01 0701 - 09/02 0700 In: 1590.5 [I.V.:550; IV Piggyback:400; TPN:640.5] Out: 300 [Urine:300]  Labs:  Recent Labs  01/17/14 1827 01/17/14 1836 01/18/14 0406  WBC 24.4*  --  18.4*  HGB 8.6* 9.2* 9.5*  HCT 26.2* 27.0* 28.4*  PLT 346  --  291     Recent Labs  01/17/14 1827 01/17/14 1836 01/18/14 0406 01/18/14 0407 01/19/14 0500  NA  --  134* 137  --  136*  K  --  2.9* 3.2*  --  4.0  CL  --  98 97  --  99  CO2  --   --  27  --  25  GLUCOSE  --  146* 124*  --  215*  BUN  --  63* 73*  --  74*  CREATININE  --  2.60* 2.44*  --  2.56*  CALCIUM  --   --  7.9*  --  7.9*  MG 2.5  --   --  2.5 2.6*  PHOS  --   --   --  4.3 4.7*  PROT  --   --  5.4*  --  5.5*  ALBUMIN  --   --  1.1*  --  1.1*  AST  --   --  17  --  20  ALT  --   --  7  --  <5  ALKPHOS  --   --  133*  --  117  BILITOT  --   --  0.2*  --  0.2*  TRIG  --   --   --   --  132   Estimated Creatinine Clearance: 16.3 ml/min (by C-G formula based on Cr of 2.56).    Recent Labs  01/18/14 2008 01/19/14 0026 01/19/14 0431  GLUCAP 171* 184* 227*      Insulin Requirements in the past 24 hours:  5 units sensitive SSI  Assessment: 87 YOF admitted with PEG tube failure and started on TPN for nutritional support.  GI: PEG tube has surrounding tissue ulceration - MDs rec EOL discussion. Endo: DM, CBGs trending up (132-227), on SSI Lytes: low Na, Phos elevated at 4.7 (Ca x Phos = 48, goal < 55), Mag slightly elevated Renal: gout. Neurogenic bladder with indwelling foley.  CKD - SCr up 2.56 Pulm: trach  dependent, FiO2 30% - pleural effusions on CT Cards: HTN / CHF - BP soft, HR normal, pBNP 17040 - on IV Lopressor Hepatobil: LFTs WNL, TG WNL - liver lesion on CT Neuro: hx CVA / depression ID: Cubicin/Primaxin for cellulitis surrounding PEG tube, afebrile, WBC elevated at 18.4 Best Practices: heparin SQ, CHG , PPI IV TPN Access: triple lumen placed 9/1 TPN day#: 1 (9/1 >> )  Current Nutrition:  TPN  Nutritional Goals:  1500-1600 kCal, 90-100 grams of protein per day   Plan:  - Clinimix E 5/15 at 40 ml/hr + IVFE at 10 ml/hr.  TPN providing 1162 kCal and 48 gm protein daily.  Will advance rate once CBGs are better controlled. - Daily multivitamin and trace elements - Add 10 units regular insulin in  TPN - Labs in AM, consider removing electrolytes from TPN if renal function worsens and lytes continue to rise    Tricia Reed, PharmD, BCPS Pager:  [phone] 01/19/2014, 8:18 AM

## 2014-01-19 NOTE — Consult Note (Signed)
I have reviewed and discussed case with Nurse Practitioner And agree with documentation and plan as noted above   Javari Bufkin J. Sakai Wolford D.O.  Palliative Medicine Team at [facility]  Team Phone: [phone]    

## 2014-01-19 NOTE — Clinical Documentation Improvement (Signed)
PLEASE SPECIFY TYPE AND ACUITY OF CHF: Possible Clinical Conditions?  Chronic Systolic Congestive Heart Failure Chronic Diastolic Congestive Heart Failure Chronic Systolic & Diastolic Congestive Heart Failure Acute Systolic Congestive Heart Failure Acute Diastolic Congestive Heart Failure Acute Systolic & Diastolic Congestive Heart Failure Acute on Chronic Systolic Congestive Heart Failure Acute on Chronic Diastolic Congestive Heart Failure Acute on Chronic Systolic & Diastolic Congestive Heart Failure Other Condition Cannot Clinically Determine  Supporting Information:(As per notes) Pt has hx of CHF   Thank You, Nevin Bloodgood, RN, BSN, CCDS,Clinical Documentation Specialist:  [phone]  [phone]=[facility] [place]- Health Information Management

## 2014-01-20 DIAGNOSIS — E138 Other specified diabetes mellitus with unspecified complications: Secondary | ICD-10-CM

## 2014-01-20 LAB — COMPREHENSIVE METABOLIC PANEL
ALBUMIN: 1.1 g/dL — AB (ref 3.5–5.2)
ALT: 6 U/L (ref 0–35)
ANION GAP: 14 (ref 5–15)
AST: 21 U/L (ref 0–37)
Alkaline Phosphatase: 111 U/L (ref 39–117)
BUN: 80 mg/dL — AB (ref 6–23)
CO2: 24 mEq/L (ref 19–32)
CREATININE: 2.52 mg/dL — AB (ref 0.50–1.10)
Calcium: 7.9 mg/dL — ABNORMAL LOW (ref 8.4–10.5)
Chloride: 98 mEq/L (ref 96–112)
GFR calc Af Amer: 19 mL/min — ABNORMAL LOW (ref >90)
GFR calc non Af Amer: 16 mL/min — ABNORMAL LOW (ref >90)
Glucose, Bld: 152 mg/dL — ABNORMAL HIGH (ref 70–99)
POTASSIUM: 3.3 meq/L — AB (ref 3.7–5.3)
Sodium: 136 mEq/L — ABNORMAL LOW (ref 137–147)
Total Bilirubin: 0.2 mg/dL — ABNORMAL LOW (ref 0.3–1.2)
Total Protein: 5.8 g/dL — ABNORMAL LOW (ref 6.0–8.3)

## 2014-01-20 LAB — PHOSPHORUS: Phosphorus: 5.4 mg/dL — ABNORMAL HIGH (ref 2.3–4.6)

## 2014-01-20 LAB — GLUCOSE, CAPILLARY
GLUCOSE-CAPILLARY: 134 mg/dL — AB (ref 70–99)
GLUCOSE-CAPILLARY: 156 mg/dL — AB (ref 70–99)
Glucose-Capillary: 144 mg/dL — ABNORMAL HIGH (ref 70–99)
Glucose-Capillary: 147 mg/dL — ABNORMAL HIGH (ref 70–99)
Glucose-Capillary: 149 mg/dL — ABNORMAL HIGH (ref 70–99)
Glucose-Capillary: 156 mg/dL — ABNORMAL HIGH (ref 70–99)
Glucose-Capillary: 186 mg/dL — ABNORMAL HIGH (ref 70–99)

## 2014-01-20 LAB — MAGNESIUM: MAGNESIUM: 2.5 mg/dL (ref 1.5–2.5)

## 2014-01-20 LAB — TRIGLYCERIDES: Triglycerides: 98 mg/dL (ref <150)

## 2014-01-20 MED ORDER — TRACE MINERALS CR-CU-F-FE-I-MN-MO-SE-ZN IV SOLN
INTRAVENOUS | Status: AC
  Administered 2014-01-20: 17:00:00 via INTRAVENOUS
  Filled 2014-01-20: qty 2000

## 2014-01-20 MED ORDER — FAT EMULSION 20 % IV EMUL
250.0000 mL | INTRAVENOUS | Status: AC
  Administered 2014-01-20: 250 mL via INTRAVENOUS
  Filled 2014-01-20: qty 250

## 2014-01-20 NOTE — Progress Notes (Signed)
PARENTERAL NUTRITION CONSULT NOTE - FOLLOW UP  Pharmacy Consult:  TPN Indication:  PEG tube dysfunction  No Known Allergies  Patient Measurements: Height:  (157.5 cm) Weight: 187 lb 6.3 oz (85 kg) IBW/kg (Calculated) : 50.1  Vital Signs: Temp: 97.6 F (36.4 C) (09/03 0818) Temp src: Oral (09/03 0818) BP: 139/47 mmHg (09/03 0818) Pulse Rate: 53 (09/03 0818) Intake/Output from previous day: 09/02 0701 - 09/03 0700 In: 670 [IV Piggyback:100; TPN:570] Out: 400 [Urine:400]  Labs:  Recent Labs  01/17/14 1827 01/17/14 1836 01/18/14 0406  WBC 24.4*  --  18.4*  HGB 8.6* 9.2* 9.5*  HCT 26.2* 27.0* 28.4*  PLT 346  --  291     Recent Labs  01/17/14 1827 01/17/14 1836 01/18/14 0406 01/18/14 0407 01/19/14 0500  NA  --  134* 137  --  136*  K  --  2.9* 3.2*  --  4.0  CL  --  98 97  --  99  CO2  --   --  27  --  25  GLUCOSE  --  146* 124*  --  215*  BUN  --  63* 73*  --  74*  CREATININE  --  2.60* 2.44*  --  2.56*  CALCIUM  --   --  7.9*  --  7.9*  MG 2.5  --   --  2.5 2.6*  PHOS  --   --   --  4.3 4.7*  PROT  --   --  5.4*  --  5.5*  ALBUMIN  --   --  1.1*  --  1.1*  AST  --   --  17  --  20  ALT  --   --  7  --  <5  ALKPHOS  --   --  133*  --  117  BILITOT  --   --  0.2*  --  0.2*  PREALBUMIN  --   --   --   --  3.2*  TRIG  --   --   --   --  132   Estimated Creatinine Clearance: 16.3 ml/min (by C-G formula based on Cr of 2.56).    Recent Labs  01/20/14 01/20/14 0137 01/20/14 0412  GLUCAP 186* 149* 134*      Insulin Requirements in the past 24 hours:  13 units sensitive SSI  Assessment: 87 YOF admitted with PEG tube failure and started on TPN for nutritional support.  GI: PEG tube has surrounding tissue ulceration - MDs rec EOL discussion. Endo: DM, CBGs 134-190 , on SSI and 10 units insulin in TPN Lytes: low Na, Phos elevated at 5.4, CoCa 10.22  (Ca x Phos = 55), Mag 2.5 Renal: gout. Neurogenic bladder with indwelling foley.  CKD - SCr  2.52 Pulm: trach dependent, FiO2 30% - pleural effusions on CT Cards: HTN / CHF - BP soft, HR 45-91, pBNP 17040 - on IV Lopressor Hepatobil: LFTs WNL, TG WNL - liver lesion on CT Neuro: hx CVA / depression ID: Cubicin/Primaxin for cellulitis surrounding PEG tube, afebrile, WBC elevated at 18.4 Best Practices: heparin SQ, CHG , PPI IV TPN Access: triple lumen placed 9/1 TPN day#: 2 (9/1 >> )  Current Nutrition:  TPN  Nutritional Goals:  1500-1600 kCal, 90-100 grams of protein per day   Plan:  - Change Clinimix E 5/15 to NO electrolytes and increase rate to 60 ml/hr + IVFE at 10 ml/hr.  TPN providing 1228 kCal and 72 gm protein  daily.  Will advance rate once CBGs are better controlled. - Daily multivitamin and trace elements - Cont 10 units regular insulin per liter in TPN - Labs in AM    Talbert Cage, PharmD Pager:  [phone] 01/20/2014, 8:29 AM

## 2014-01-20 NOTE — Progress Notes (Signed)
[facility] PROGRESS NOTE  Tricia Reed OZH:[id number] DOB: 27-Jun-1926 DOA: 01/17/2014 PCP: No primary provider on file. Interim summary: [name] is a 80 y.o. Female with prior h/o STEMI, s/p CABG, on trach vent dependent, c diff colitis, DM, CVA, dysphagia s/p PEG tube, anemia, was admitted from [facility] for PEG tube dysfunction. There is continuous leak from the PEG tube with surrounding area of cellulitis. She was started on IV antibiotics with IV Primaxin and IV cubicin. Pulmonary consulted for vent management.   Assessment/Plan:  Cellulitis from the PEG tube site with continuous leak: general surgery on board, patient is not a surgical candidate. No tube feeds. On IV antibiotics for the cellulitis around the peg tube day 4. Plan to complete 7 days of antibiotics.   Mild to moderate malnutrition on TNA: Palliative care consulted and recommendations given. SLP consulted.   Acute renal failure: baseline around 2. Her creatinine is 2.56 with bUN of 74.    Chronic respiratory failure: vent dependent from [facility]. Pulmonary consulted for vent management.     Code Status: Partial CODE.  Family Communication: discussed with son over the phone on 9/2.  Disposition Plan: pending.    Consultants:  Pulmonary  ID  Surgery.   pallIATIVE CARE   Procedures:  CT abdomen and pelvis  Left subclavian central line 9/1  CXR  Antibiotics:  cubicin  Fluconazole  Primaxin.   HPI/Subjective: Comfortable.   Objective: Filed Vitals:   01/20/14 0818  BP: 139/47  Pulse: 53  Temp: 97.6 F (36.4 C)  Resp: 26    Intake/Output Summary (Last 24 hours) at 01/20/14 1022 Last data filed at 01/20/14 0012  Gross per 24 hour  Intake    414 ml  Output    400 ml  Net     14 ml   Filed Weights   01/17/14 2200 01/20/14 0402  Weight: 85.7 kg (188 lb 15 oz) 85 kg (187 lb 6.3 oz)    Exam:   General:  Alert afebrile comfortable  Cardiovascular:  s1s2  Respiratory: diminished air entry at bases.   Abdomen: soft, PEG in place with surrounding white based ulcers and erythema.   Musculoskeletal: no edema or cyanosis.   Data Reviewed: Basic Metabolic Panel:  Recent Labs Lab 01/17/14 1827 01/17/14 1836 01/18/14 0406 01/18/14 0407 01/19/14 0500  NA  --  134* 137  --  136*  K  --  2.9* 3.2*  --  4.0  CL  --  98 97  --  99  CO2  --   --  27  --  25  GLUCOSE  --  146* 124*  --  215*  BUN  --  63* 73*  --  74*  CREATININE  --  2.60* 2.44*  --  2.56*  CALCIUM  --   --  7.9*  --  7.9*  MG 2.5  --   --  2.5 2.6*  PHOS  --   --   --  4.3 4.7*   Liver Function Tests:  Recent Labs Lab 01/18/14 0406 01/19/14 0500  AST 17 20  ALT 7 <5  ALKPHOS 133* 117  BILITOT 0.2* 0.2*  PROT 5.4* 5.5*  ALBUMIN 1.1* 1.1*   No results found for this basename: LIPASE, AMYLASE,  in the last 168 hours No results found for this basename: AMMONIA,  in the last 168 hours CBC:  Recent Labs Lab 01/17/14 1827 01/17/14 1836 01/18/14 0406  WBC 24.4*  --  18.4*  NEUTROABS 21.4*  --   --   HGB 8.6* 9.2* 9.5*  HCT 26.2* 27.0* 28.4*  MCV 83.4  --  83.3  PLT 346  --  291   Cardiac Enzymes:  Recent Labs Lab 01/17/14 1827 01/17/14 2221 January 26, 2014 0407 01/26/14 1345  CKTOTAL 32  --   --  47  TROPONINI  --  <0.30 <0.30 <0.30   BNP (last 3 results)  Recent Labs  2014-01-26 0407  PROBNP 17040.0*   CBG:  Recent Labs Lab 01/19/14 1943 01/20/14 01/20/14 0137 01/20/14 0412 01/20/14 0815  GLUCAP 190* 186* 149* 134* 147*    Recent Results (from the past 240 hour(s))  MRSA PCR SCREENING     Status: None   Collection Time    01/17/14 11:08 PM      Result Value Ref Range Status   MRSA by PCR NEGATIVE  NEGATIVE Final   Comment:            The GeneXpert MRSA Assay (FDA     approved for NASAL specimens     only), is one component of a     comprehensive MRSA colonization     surveillance program. It is not     intended to diagnose  MRSA     infection nor to guide or     monitor treatment for     MRSA infections.  CLOSTRIDIUM DIFFICILE BY PCR     Status: None   Collection Time    01-26-2014  3:04 PM      Result Value Ref Range Status   C difficile by pcr NEGATIVE  NEGATIVE Final     Studies: Ct Abdomen Wo Contrast  2014/01/26   CLINICAL DATA:  Evaluate PEG tube position abdominal wall.  EXAM: CT ABDOMEN WITHOUT CONTRAST  TECHNIQUE: Multidetector CT imaging of the abdomen was performed following the standard protocol without IV contrast.  COMPARISON:  None.  FINDINGS: There are moderate bilateral pleural effusions, incompletely visualized. Patchy opacities are partially visualized in the bilateral lower lobes, right middle lobe, and lingula, with evaluation partially limited by respiratory motion artifact. Coronary artery calcification is partially visualized. Mildly decreased attenuation of the blood pool is suggestive anemia.  There is a 1.2 cm hypodensity in the left hepatic lobe with associated calcification. Multiple calcified gallstones are present. No biliary dilatation is identified. Calcification at the medial aspect of the spleen is favored to be vascular. The adrenal glands and pancreas are unremarkable. Right lower pole renal cyst measures 8.4 cm. Right renal parapelvic cyst measures 2.5 cm. 3 mm nonobstructing calculus is present in the upper pole of the left kidney. 2 mm nonobstructing stone versus vascular calcification is present in the left lower pole. Left renal interpolar cyst measures 4.3 cm.  A percutaneous gastrostomy tube is present. The balloon is inflated, with the tube in the distal gastric body. The stomach is apposed to the anterior abdominal wall at the gastrostomy site. There is evidence of skin/subcutaneous soft tissue ulceration at the gastrostomy site. No fluid collection is seen in this area. The visualized small and large bowel are nondilated.  No intraperitoneal free fluid or free air is seen in the  abdomen. No enlarged lymph nodes are identified. Moderate atherosclerotic calcification is noted of the abdominal aorta and its major branch vessels. There is moderate, diffuse body wall edema. Sclerosis is noted in the right ilium adjacent to the sacroiliac joint. Multilevel thoracolumbar disc degeneration and lower lumbar facet arthrosis are noted.  IMPRESSION: 1. Gastrostomy  tube in place in the distal stomach. Superficial soft tissue ulceration at the gastrostomy site. No fluid collection. 2. Cholelithiasis. 3. Anasarca and moderate bilateral pleural effusions. Partially visualized lung opacities, which could reflect edema or multifocal infection. 4. Small, partially calcified low-density liver lesion. This is nonspecific and favored to be benign, such as a cyst related to prior infection or hemangioma, although a metastatic or primary malignant lesion cannot be completely excluded. Further evaluation with contrast-enhanced abdominal MRI could be performed on an elective basis as clinically warranted based on patient's other comorbidities.   Electronically Signed   By: Sebastian Ache   On: 01/18/2014 14:57    Scheduled Meds: . chlorhexidine  15 mL Mouth/Throat BID  . DAPTOmycin (CUBICIN)  IV  500 mg Intravenous Q48H  . furosemide  40 mg Intravenous Daily  . heparin subcutaneous  5,000 Units Subcutaneous 3 times per day  . imipenem-cilastatin  250 mg Intravenous Q12H  . insulin aspart  0-9 Units Subcutaneous 6 times per day  . metoprolol  5 mg Intravenous Q6H  . pantoprazole (PROTONIX) IV  40 mg Intravenous Q24H  . silver sulfADIAZINE   Topical Daily   Continuous Infusions: . Marland KitchenTPN (CLINIMIX-E) Adult 40 mL/hr at 01/19/14 1744   And  . fat emulsion 250 mL (01/19/14 1743)    Principal Problem:   PEG tube malfunction Active Problems:   Chronic respiratory failure   Tracheostomy in place   Ventilator dependent   Sepsis   CKD (chronic kidney disease)   Hypokalemia   Anemia   Acute on chronic  respiratory failure   Palliative care encounter    Time spent: 35 minutes.     [facility]  [facility] Pager [phone] If 7PM-7AM, please contact night-coverage at [email or web address], password [facility] 01/20/2014, 10:22 AM  LOS: 3 days

## 2014-01-20 NOTE — Progress Notes (Signed)
NUTRITION FOLLOW UP  DOCUMENTATION CODES Per approved criteria  -Obesity Unspecified   INTERVENTION:  TPN per pharmacy RD to follow for nutrition care plan  NUTRITION DIAGNOSIS: Altered GI function as evidenced by malfunctioning PEG tube as evidenced by TPN initiation, ongoing  Goal: Pt to meet >/= 90% of their estimated nutrition needs, progressing  Monitor:  TPN prescription, weight, labs, I/O's  ASSESSMENT: 80 y.o. Female with PMH DM, trach, dysphagia, C diff, gout, HTN and CVA; admitted from [facility] after PEG tube became non-functional.  Patient is currently on ventilator support -- trach  MV: 14.0 L/min Temp (24hrs), Avg:97.7 F (36.5 C), Min:97.3 F (36.3 C), Max:98.5 F (36.9 C)   Patient continues to receive TPN.  Current prescription is Clinimix E 5/15 @ 40 ml/hr and lipids @ 10 ml/hr. Provides 1162 kcal and 48 grams protein per day.  To advance once CBG's are better controlled.  Meets 73% minimum re-estimated energy needs and 53% minimum estimated protein needs.  Palliative Care Team following.  Surgery note 9/3 reviewed.  Pt is not a surgical candidate.  Can consider removing or replacing the PEG in 4 weeks.  Height: Ht Readings from Last 1 Encounters:  01/17/14  (1.575 m)    Weight: Wt Readings from Last 1 Encounters:  01/20/14 187 lb 6.3 oz (85 kg)    BMI:  Body mass index is 34.27 kg/(m^2).  Re-estimated Needs: Kcal: 1585 Protein: 90-100 gm Fluid: per MD  Skin: Stage II pressure ulcer to left buttock  Diet Order: NPO   Intake/Output Summary (Last 24 hours) at 01/20/14 1141 Last data filed at 01/20/14 0012  Gross per 24 hour  Intake    362 ml  Output    400 ml  Net    -38 ml    Labs:   Recent Labs Lab 01/17/14 1827 01/17/14 1836 01/18/14 0406 01/18/14 0407 01/19/14 0500  NA  --  134* 137  --  136*  K  --  2.9* 3.2*  --  4.0  CL  --  98 97  --  99  CO2  --   --  27  --  25  BUN  --  63* 73*  --  74*  CREATININE  --   2.60* 2.44*  --  2.56*  CALCIUM  --   --  7.9*  --  7.9*  MG 2.5  --   --  2.5 2.6*  PHOS  --   --   --  4.3 4.7*  GLUCOSE  --  146* 124*  --  215*    CBG (last 3)   Recent Labs  01/20/14 0137 01/20/14 0412 01/20/14 0815  GLUCAP 149* 134* 147*    Scheduled Meds: . chlorhexidine  15 mL Mouth/Throat BID  . DAPTOmycin (CUBICIN)  IV  500 mg Intravenous Q48H  . furosemide  40 mg Intravenous Daily  . heparin subcutaneous  5,000 Units Subcutaneous 3 times per day  . imipenem-cilastatin  250 mg Intravenous Q12H  . insulin aspart  0-9 Units Subcutaneous 6 times per day  . metoprolol  5 mg Intravenous Q6H  . pantoprazole (PROTONIX) IV  40 mg Intravenous Q24H  . silver sulfADIAZINE   Topical Daily    Continuous Infusions: . Marland KitchenTPN (CLINIMIX-E) Adult 40 mL/hr at 01/19/14 1744   And  . fat emulsion 250 mL (01/19/14 1743)    Past Medical History  Diagnosis Date  . Diabetes mellitus without complication   . Tracheostomy dependence   .  Dysphagia   . Clostridium difficile diarrhea   . Gout   . Hypertension   . CVA (cerebral infarction)   . CHF (congestive heart failure)   . Anemia   . Depressed   . Neurogenic bladder   . Stroke   . STEMI (ST elevation myocardial infarction)     Past Surgical History  Procedure Laterality Date  . Peg placement    . Tracheostomy      Maureen Chatters, RD, LDN Pager #: [phone] After-Hours Pager #: [phone]

## 2014-01-20 NOTE — Progress Notes (Signed)
ANTIBIOTIC CONSULT NOTE Pharmacy Consult for Daptomycin + Primaxin Indication: cellulitis (7 days planned)  No Known Allergies  Labs:  Recent Labs  01/17/14 1827 01/17/14 1836 01/18/14 0406 01/19/14 0500  WBC 24.4*  --  18.4*  --   HGB 8.6* 9.2* 9.5*  --   PLT 346  --  291  --   CREATININE  --  2.60* 2.44* 2.56*    Microbiology: Recent Results (from the past 720 hour(s))  MRSA PCR SCREENING     Status: None   Collection Time    01/17/14 11:08 PM      Result Value Ref Range Status   MRSA by PCR NEGATIVE  NEGATIVE Final   Comment:            The GeneXpert MRSA Assay (FDA     approved for NASAL specimens     only), is one component of a     comprehensive MRSA colonization     surveillance program. It is not     intended to diagnose MRSA     infection nor to guide or     monitor treatment for     MRSA infections.  CLOSTRIDIUM DIFFICILE BY PCR     Status: None   Collection Time    01/18/14  3:04 PM      Result Value Ref Range Status   C difficile by pcr NEGATIVE  NEGATIVE Final    Assessment: 8 yof with complex past medical history presented from [facility] with PEG tube leaking. She has been on multiple courses of antibiotics for several drug resistant bugs.   She continues on Daptomycin and Primaxin for cellulitis (7 days of treatment planned)  With renal insufficiency  Goal of Therapy:  Eradication of infection  Plan:  Continue Primaxin 250 mg iv Q 12 hours Continue Daptomycin 500 mg iv Q 48 hours  Thank you. Okey Regal, PharmD [phone] 01/20/2014,9:08 AM

## 2014-01-20 NOTE — Progress Notes (Signed)
Local care for now.

## 2014-01-20 NOTE — Progress Notes (Signed)
Ms. Desrosiers was sleeping when I came by. I did speak with her son about SLP FEES test and he would like for his mother to decide if she wants this or not. He does tell me that he wishes to continue TPN for nutrition and does not sound like he wishes to pursue more comfort feeds at this point as allowing her to eat on the ventilator would be high risk - so I'm not sure pursuing FEES test would be worth putting her through. They continue with comfort being the top priority but also would like to continue treatment, artificial nutrition, ventilation.   Yong Channel, NP Palliative Medicine Team Pager # [phone] (M-F 8a-5p) Team Phone # [phone] (Nights/Weekends)

## 2014-01-20 NOTE — Progress Notes (Signed)
Patient ID: Tricia Reed, female   DOB: 1926-07-13, 80 y.o.   MRN: [id number]     [facility]      [address] [place], [place] [address]    Phone: [phone] FAX: [phone]     Subjective: Palliative care on board.  Afebrile.    Objective:  Vital signs:  Filed Vitals:   01/20/14 0024 01/20/14 0100 01/20/14 0402 01/20/14 0600  BP: 113/61 122/61 136/58 128/58  Pulse: 101 87 46 63  Temp:   97.9 F (36.6 C)   TempSrc:   Oral   Resp: $Remo'28 20 20 23  'uvAzJ$ Height:      Weight:   187 lb 6.3 oz (85 kg)   SpO2: 100% 98% 98% 99%    Last BM Date: 01/17/14  Intake/Output   Yesterday:  09/02 0701 - 09/03 0700 In: 670 [IV Piggyback:100; TPN:570] Out: 400 [Urine:400] This shift:    I/O last 3 completed shifts: In: 1462.5 [IV XAJOINOMV:672] Out: 600 [Urine:600]    Physical Exam: Abdomen: Soft.  Nondistended.  Moderate amount of gastric secretions, burns and excoriations noted around the site.  No evidence of peritonitis.  No incarcerated hernias.   Problem List:   Principal Problem:   PEG tube malfunction Active Problems:   Chronic respiratory failure   Tracheostomy in place   Ventilator dependent   Sepsis   CKD (chronic kidney disease)   Hypokalemia   Anemia   Acute on chronic respiratory failure   Palliative care encounter    Results:   Labs: Results for orders placed during the hospital encounter of 01/17/14 (from the past 48 hour(s))  GLUCOSE, CAPILLARY     Status: Abnormal   Collection Time    01/18/14  8:13 AM      Result Value Ref Range   Glucose-Capillary 132 (*) 70 - 99 mg/dL  GLUCOSE, CAPILLARY     Status: Abnormal   Collection Time    01/18/14 11:57 AM      Result Value Ref Range   Glucose-Capillary 119 (*) 70 - 99 mg/dL  TROPONIN I     Status: None   Collection Time    01/18/14  1:45 PM      Result Value Ref Range   Troponin I <0.30  <0.30 ng/mL   Comment:            Due to the release kinetics  of cTnI,     a negative result within the first hours     of the onset of symptoms does not rule out     myocardial infarction with certainty.     If myocardial infarction is still suspected,     repeat the test at appropriate intervals.  CK     Status: None   Collection Time    01/18/14  1:45 PM      Result Value Ref Range   Total CK 47  7 - 177 U/L  CLOSTRIDIUM DIFFICILE BY PCR     Status: None   Collection Time    01/18/14  3:04 PM      Result Value Ref Range   C difficile by pcr NEGATIVE  NEGATIVE  GLUCOSE, CAPILLARY     Status: Abnormal   Collection Time    01/18/14  4:46 PM      Result Value Ref Range   Glucose-Capillary 132 (*) 70 - 99 mg/dL  GLUCOSE, CAPILLARY     Status: Abnormal   Collection Time  01/18/14  8:08 PM      Result Value Ref Range   Glucose-Capillary 171 (*) 70 - 99 mg/dL   Comment 1 Notify RN    GLUCOSE, CAPILLARY     Status: Abnormal   Collection Time    01/19/14 12:26 AM      Result Value Ref Range   Glucose-Capillary 184 (*) 70 - 99 mg/dL   Comment 1 Notify RN    GLUCOSE, CAPILLARY     Status: Abnormal   Collection Time    01/19/14  4:31 AM      Result Value Ref Range   Glucose-Capillary 227 (*) 70 - 99 mg/dL   Comment 1 Notify RN    COMPREHENSIVE METABOLIC PANEL     Status: Abnormal   Collection Time    01/19/14  5:00 AM      Result Value Ref Range   Sodium 136 (*) 137 - 147 mEq/L   Potassium 4.0  3.7 - 5.3 mEq/L   Chloride 99  96 - 112 mEq/L   CO2 25  19 - 32 mEq/L   Glucose, Bld 215 (*) 70 - 99 mg/dL   BUN 74 (*) 6 - 23 mg/dL   Creatinine, Ser 2.56 (*) 0.50 - 1.10 mg/dL   Calcium 7.9 (*) 8.4 - 10.5 mg/dL   Total Protein 5.5 (*) 6.0 - 8.3 g/dL   Albumin 1.1 (*) 3.5 - 5.2 g/dL   AST 20  0 - 37 U/L   ALT <5  0 - 35 U/L   Alkaline Phosphatase 117  39 - 117 U/L   Total Bilirubin 0.2 (*) 0.3 - 1.2 mg/dL   GFR calc non Af Amer 16 (*) >90 mL/min   GFR calc Af Amer 19 (*) >90 mL/min   Comment: (NOTE)     The eGFR has been calculated  using the CKD EPI equation.     This calculation has not been validated in all clinical situations.     eGFR's persistently <90 mL/min signify possible Chronic Kidney     Disease.   Anion gap 12  5 - 15  MAGNESIUM     Status: Abnormal   Collection Time    01/19/14  5:00 AM      Result Value Ref Range   Magnesium 2.6 (*) 1.5 - 2.5 mg/dL  PHOSPHORUS     Status: Abnormal   Collection Time    01/19/14  5:00 AM      Result Value Ref Range   Phosphorus 4.7 (*) 2.3 - 4.6 mg/dL  PREALBUMIN     Status: Abnormal   Collection Time    01/19/14  5:00 AM      Result Value Ref Range   Prealbumin 3.2 (*) 17.0 - 34.0 mg/dL   Comment: Performed at [facility]     Status: None   Collection Time    01/19/14  5:00 AM      Result Value Ref Range   Triglycerides 132  <150 mg/dL  GLUCOSE, CAPILLARY     Status: Abnormal   Collection Time    01/19/14  7:53 AM      Result Value Ref Range   Glucose-Capillary 245 (*) 70 - 99 mg/dL  GLUCOSE, CAPILLARY     Status: Abnormal   Collection Time    01/19/14 11:25 AM      Result Value Ref Range   Glucose-Capillary 228 (*) 70 - 99 mg/dL  GLUCOSE, CAPILLARY     Status: Abnormal  Collection Time    01/19/14  3:56 PM      Result Value Ref Range   Glucose-Capillary 213 (*) 70 - 99 mg/dL  GLUCOSE, CAPILLARY     Status: Abnormal   Collection Time    01/19/14  7:43 PM      Result Value Ref Range   Glucose-Capillary 190 (*) 70 - 99 mg/dL   Comment 1 Notify RN    GLUCOSE, CAPILLARY     Status: Abnormal   Collection Time    01/20/14 12:00 AM      Result Value Ref Range   Glucose-Capillary 186 (*) 70 - 99 mg/dL   Comment 1 Notify RN    GLUCOSE, CAPILLARY     Status: Abnormal   Collection Time    01/20/14  1:37 AM      Result Value Ref Range   Glucose-Capillary 149 (*) 70 - 99 mg/dL  GLUCOSE, CAPILLARY     Status: Abnormal   Collection Time    01/20/14  4:12 AM      Result Value Ref Range   Glucose-Capillary 134 (*) 70 - 99 mg/dL    Comment 1 Notify RN      Imaging / Studies: Ct Abdomen Wo Contrast  2014-02-02   CLINICAL DATA:  Evaluate PEG tube position abdominal wall.  EXAM: CT ABDOMEN WITHOUT CONTRAST  TECHNIQUE: Multidetector CT imaging of the abdomen was performed following the standard protocol without IV contrast.  COMPARISON:  None.  FINDINGS: There are moderate bilateral pleural effusions, incompletely visualized. Patchy opacities are partially visualized in the bilateral lower lobes, right middle lobe, and lingula, with evaluation partially limited by respiratory motion artifact. Coronary artery calcification is partially visualized. Mildly decreased attenuation of the blood pool is suggestive anemia.  There is a 1.2 cm hypodensity in the left hepatic lobe with associated calcification. Multiple calcified gallstones are present. No biliary dilatation is identified. Calcification at the medial aspect of the spleen is favored to be vascular. The adrenal glands and pancreas are unremarkable. Right lower pole renal cyst measures 8.4 cm. Right renal parapelvic cyst measures 2.5 cm. 3 mm nonobstructing calculus is present in the upper pole of the left kidney. 2 mm nonobstructing stone versus vascular calcification is present in the left lower pole. Left renal interpolar cyst measures 4.3 cm.  A percutaneous gastrostomy tube is present. The balloon is inflated, with the tube in the distal gastric body. The stomach is apposed to the anterior abdominal wall at the gastrostomy site. There is evidence of skin/subcutaneous soft tissue ulceration at the gastrostomy site. No fluid collection is seen in this area. The visualized small and large bowel are nondilated.  No intraperitoneal free fluid or free air is seen in the abdomen. No enlarged lymph nodes are identified. Moderate atherosclerotic calcification is noted of the abdominal aorta and its major branch vessels. There is moderate, diffuse body wall edema. Sclerosis is noted in the  right ilium adjacent to the sacroiliac joint. Multilevel thoracolumbar disc degeneration and lower lumbar facet arthrosis are noted.  IMPRESSION: 1. Gastrostomy tube in place in the distal stomach. Superficial soft tissue ulceration at the gastrostomy site. No fluid collection. 2. Cholelithiasis. 3. Anasarca and moderate bilateral pleural effusions. Partially visualized lung opacities, which could reflect edema or multifocal infection. 4. Small, partially calcified low-density liver lesion. This is nonspecific and favored to be benign, such as a cyst related to prior infection or hemangioma, although a metastatic or primary malignant lesion cannot be completely excluded. Further  evaluation with contrast-enhanced abdominal MRI could be performed on an elective basis as clinically warranted based on patient's other comorbidities.   Electronically Signed   By: Logan Bores   On: 01/18/2014 14:57    Medications / Allergies:  Scheduled Meds: . chlorhexidine  15 mL Mouth/Throat BID  . DAPTOmycin (CUBICIN)  IV  500 mg Intravenous Q48H  . furosemide  40 mg Intravenous Daily  . heparin subcutaneous  5,000 Units Subcutaneous 3 times per day  . imipenem-cilastatin  250 mg Intravenous Q12H  . insulin aspart  0-9 Units Subcutaneous 6 times per day  . metoprolol  5 mg Intravenous Q6H  . pantoprazole (PROTONIX) IV  40 mg Intravenous Q24H  . silver sulfADIAZINE   Topical Daily   Continuous Infusions: . Marland KitchenTPN (CLINIMIX-E) Adult 40 mL/hr at 01/19/14 1744   And  . fat emulsion 250 mL (01/19/14 1743)   PRN Meds:.  Antibiotics: Anti-infectives   Start     Dose/Rate Route Frequency Ordered Stop   01/20/14 1000  DAPTOmycin (CUBICIN) 500 mg in sodium chloride 0.9 % IVPB     500 mg 220 mL/hr over 30 Minutes Intravenous Every 48 hours 01/18/14 1450     01/18/14 2200  ceFEPIme (MAXIPIME) 1 g in dextrose 5 % 50 mL IVPB  Status:  Discontinued     1 g 100 mL/hr over 30 Minutes Intravenous Every 24 hours 01/17/14 2103  01/17/14 2108   01/18/14 1000  DAPTOmycin (CUBICIN) 500 mg in sodium chloride 0.9 % IVPB  Status:  Discontinued     500 mg 220 mL/hr over 30 Minutes Intravenous Every 24 hours 01/18/14 0904 01/18/14 1450   01/18/14 0000  fluconazole (DIFLUCAN) IVPB 200 mg  Status:  Discontinued     200 mg 100 mL/hr over 60 Minutes Intravenous Every 24 hours 01/17/14 2128 01/18/14 1146   01/17/14 2200  imipenem-cilastatin (PRIMAXIN) 500 mg in sodium chloride 0.9 % 100 mL IVPB  Status:  Discontinued     500 mg 200 mL/hr over 30 Minutes Intravenous 3 times per day 01/17/14 2107 01/17/14 2111   01/17/14 2130  imipenem-cilastatin (PRIMAXIN) 250 mg in sodium chloride 0.9 % 100 mL IVPB     250 mg 200 mL/hr over 30 Minutes Intravenous Every 12 hours 01/17/14 2112     01/17/14 2115  ceFEPIme (MAXIPIME) 2 g in dextrose 5 % 50 mL IVPB  Status:  Discontinued     2 g 100 mL/hr over 30 Minutes Intravenous  Once 01/17/14 2103 01/17/14 2108        Assessment/Plan PEG site excoriation/ inflammation(placement in stomach verified)  -continue with silvadene -Can consider removing or replacing the PEG in 4 weeks -not a surgical candidate -palliative care on board  Erby Pian, ANP-BC [facility] Pager [phone](7A-4:30P) For consults and floor pages call [phone](7A-4:30P)  01/20/2014 7:50 AM

## 2014-01-20 NOTE — Evaluation (Addendum)
Clinical/Bedside Swallow Evaluation Patient Details  Name: Tricia Reed MRN: [id number] Date of Birth: 1926/12/20  Today's Date: 01/20/2014 Time: 4098-1191 SLP Time Calculation (min): 46 min  Past Medical History:  Past Medical History  Diagnosis Date  . Diabetes mellitus without complication   . Tracheostomy dependence   . Dysphagia   . Clostridium difficile diarrhea   . Gout   . Hypertension   . CVA (cerebral infarction)   . CHF (congestive heart failure)   . Anemia   . Depressed   . Neurogenic bladder   . Stroke   . STEMI (ST elevation myocardial infarction)    Past Surgical History:  Past Surgical History  Procedure Laterality Date  . Peg placement    . Tracheostomy     HPI:  Tricia Reed is a 80 y.o. Female with prior h/o STEMI, s/p CABG, on trach vent dependent, c diff colitis, DM, CVA, dysphagia s/p PEG tube, anemia, who was admitted from [facility] for PEG tube dysfunction. There is continuous leak from the PEG tube with surrounding area of cellulitis. Pt is not a surgical candidate and therefore Palliative Care was consulted to determine GOC, and SLP was ordered to establish safety with PO intake for potential comfort feeds.   Assessment / Plan / Recommendation Clinical Impression  Patient presents with a severe aspiration risk due to decreased ability to coordinate respirations and swallow as a result of her need for mechanical ventilation via trach. SLP provided Total A for oral care via suctioning prior to administration of 3 ice chips. Piecemeal swallows resulted in delayed coughing, with all swallows observed to occur mid-inhalation despite Max multimodal cueing provided by SLP. RN arrived upon completion to provide tracheal suctioning.   Given neurological etiology and ventilator dependence, patient would benefit from a FEES to objectively assess dysphagia to determine safest PO diet, although it is unclear what patient/family wishes are at this time (pt  responded "maybe" when asked if interested in FEES). The other alternative would be to administer additional trials of PO at bedside to clinically determine least restrictive diet, accepting the risks of silent aspiration to opt for comfort feeds. SLP will continue to follow as GOC are further established.    Aspiration Risk  Severe    Diet Recommendation NPO   Medication Administration: Via alternative means    Other  Recommendations Recommended Consults: FEES (pending GOC discussions) Oral Care Recommendations: Oral care Q4 per protocol   Follow Up Recommendations  LTACH    Frequency and Duration min 2x/week  1 week   Pertinent Vitals/Pain n/a    SLP Swallow Goals  TBD pending GOC discussions   Swallow Study Prior Functional Status       General HPI: Tricia Reed is a 80 y.o. Female with prior h/o STEMI, s/p CABG, on trach vent dependent, c diff colitis, DM, CVA, dysphagia s/p PEG tube, anemia, who was admitted from [facility] for PEG tube dysfunction. There is continuous leak from the PEG tube with surrounding area of cellulitis. Pt is not a surgical candidate and therefore Palliative Care was consulted to determine GOC, and SLP was ordered to establish safety with PO intake for potential comfort feeds. Type of Study: Bedside swallow evaluation Previous Swallow Assessment: none in chart - unclear if patient has had previous swallowing evaluations at outside facility Diet Prior to this Study: NPO Temperature Spikes Noted: No Respiratory Status: Ventilator (via trach) Trach Size and Type: Cuff;#6;Inflated;Other (Comment) (on ventilator) History of Recent Intubation: No Behavior/Cognition: Alert;Cooperative;Requires  cueing;Other (comment) (flat affect, intermittently responds to questions/cueing) Oral Cavity - Dentition: Poor condition Self-Feeding Abilities: Total assist Patient Positioning: Upright in bed Baseline Vocal Quality: Aphonic (secondary to trach with cuff  infalted) Volitional Swallow: Able to elicit    Oral/Motor/Sensory Function     Ice Chips Ice chips: Impaired Presentation: Spoon Pharyngeal Phase Impairments: Suspected delayed Swallow;Cough - Delayed;Other (comments) (discoordinated breath/swallow, ultimately needed suction)   Thin Liquid Thin Liquid: Not tested    Nectar Thick Nectar Thick Liquid: Not tested   Honey Thick Honey Thick Liquid: Not tested   Puree Puree: Not tested   Solid   GO    Solid: Not tested        Maxcine Ham, M.A. CCC-SLP [phone]  Maxcine Ham 01/20/2014,3:37 PM

## 2014-01-21 LAB — BASIC METABOLIC PANEL
Anion gap: 17 — ABNORMAL HIGH (ref 5–15)
BUN: 82 mg/dL — ABNORMAL HIGH (ref 6–23)
CO2: 19 mEq/L (ref 19–32)
Calcium: 7.7 mg/dL — ABNORMAL LOW (ref 8.4–10.5)
Chloride: 96 mEq/L (ref 96–112)
Creatinine, Ser: 2.41 mg/dL — ABNORMAL HIGH (ref 0.50–1.10)
GFR calc Af Amer: 20 mL/min — ABNORMAL LOW (ref >90)
GFR calc non Af Amer: 17 mL/min — ABNORMAL LOW (ref >90)
Glucose, Bld: 172 mg/dL — ABNORMAL HIGH (ref 70–99)
Potassium: 4.5 mEq/L (ref 3.7–5.3)
Sodium: 132 mEq/L — ABNORMAL LOW (ref 137–147)

## 2014-01-21 LAB — GLUCOSE, CAPILLARY
GLUCOSE-CAPILLARY: 171 mg/dL — AB (ref 70–99)
Glucose-Capillary: 160 mg/dL — ABNORMAL HIGH (ref 70–99)
Glucose-Capillary: 178 mg/dL — ABNORMAL HIGH (ref 70–99)
Glucose-Capillary: 189 mg/dL — ABNORMAL HIGH (ref 70–99)
Glucose-Capillary: 193 mg/dL — ABNORMAL HIGH (ref 70–99)
Glucose-Capillary: 234 mg/dL — ABNORMAL HIGH (ref 70–99)

## 2014-01-21 MED ORDER — TRACE MINERALS CR-CU-F-FE-I-MN-MO-SE-ZN IV SOLN
INTRAVENOUS | Status: AC
  Administered 2014-01-21: 18:00:00 via INTRAVENOUS
  Filled 2014-01-21: qty 2000

## 2014-01-21 MED ORDER — FAT EMULSION 20 % IV EMUL
250.0000 mL | INTRAVENOUS | Status: AC
  Administered 2014-01-21: 250 mL via INTRAVENOUS
  Filled 2014-01-21: qty 250

## 2014-01-21 NOTE — Progress Notes (Signed)
PULMONARY / CRITICAL CARE MEDICINE   Name: Tricia Reed MRN: [id number] DOB: 1926-07-20    ADMISSION DATE:  01/17/2014 CONSULTATION DATE:  01/17/2014   REFERRING MD :  [facility] hospitalist  CHIEF COMPLAINT:  Chronic respiratory failure     Brief summary:  80 year old AA female. Admitted from [facility] after PEG malposition. PCCM consulted for vent mgmt - chronic respiratory failure.   SUBJ: RASS 0. Full vent support. No distress  VITAL SIGNS: Temp:  [97.3 F (36.3 C)-98.1 F (36.7 C)] 97.7 F (36.5 C) (09/04 0835) Pulse Rate:  [35-100] 100 (09/04 0922) Resp:  [15-40] 24 (09/04 0835) BP: (105-153)/(50-75) 119/51 mmHg (09/04 0922) SpO2:  [92 %-99 %] 97 % (09/04 0835) FiO2 (%):  [30 %] 30 % (09/04 0922) HEMODYNAMICS:   VENTILATOR SETTINGS: Vent Mode:  [-] PRVC FiO2 (%):  [30 %] 30 % Set Rate:  [16 bmp] 16 bmp Vt Set:  [450 mL] 450 mL PEEP:  [5 cmH20] 5 cmH20 Plateau Pressure:  [19 cmH20-33 cmH20] 32 cmH20 INTAKE / OUTPUT:  Intake/Output Summary (Last 24 hours) at 01/21/14 1014 Last data filed at 01/21/14 0900  Gross per 24 hour  Intake 1636.17 ml  Output    600 ml  Net 1036.17 ml    PHYSICAL EXAMINATION: General:  NAD.  Neuro:  RASS 0. Diffusely weak HEENT:  WNL. + airleak w/ cuff pressure at 80.  Cardiovascular:  Reg with xtrasystoles, no M noted Lungs:  Clear anteriorly Abdomen:  Obese, soft, NT Ext: + symmetric edema  CBC No results found for this basename: WBC, HGB, HCT, PLT,  in the last 72 hours  Coag's No results found for this basename: APTT, INR,  in the last 72 hours  BMET Recent Labs     01/19/14  0500  01/20/14  1052  01/21/14  0317  NA  136*  136*  132*  K  4.0  3.3*  4.5  CL  99  98  96  CO2  BUN  74*  80*  82*  CREATININE  2.56*  2.52*  2.41*  GLUCOSE  215*  152*  172*    Electrolytes Recent Labs     01/19/14  0500  01/20/14  1052  01/21/14  0317  CALCIUM  7.9*  7.9*  7.7*  MG  2.6*  2.5   --   PHOS  4.7*  5.4*    --     Sepsis Markers No results found for this basename: LACTICACIDVEN, PROCALCITON, O2SATVEN,  in the last 72 hours  ABG No results found for this basename: PHART, PCO2ART, PO2ART,  in the last 72 hours  Liver Enzymes Recent Labs     01/19/14  0500  01/20/14  1052  AST  20  21  ALT  <5  6  ALKPHOS  117  111  BILITOT  0.2*  0.2*  ALBUMIN  1.1*  1.1*    Cardiac Enzymes Recent Labs     01/18/14  1345  TROPONINI  <0.30    Glucose Recent Labs     01/20/14  1133  01/20/14  1618  01/20/14  2059  01/21/14  0025  01/21/14  0435  01/21/14  0839  GLUCAP  156*  144*  156*  160*  171*  178*    Imaging No results found.     ASSESSMENT / PLAN:  Chronic respiratory failure/vent dependence/trach status  Pulm edema pattern on CXR Doubt PNA PEG cellulitis -  ID managing abx G tube malfunction - CCS evaluating and managing  Discussion See Dr Sung Amabile note on 9/2. She remains poor candidate for aggressive interventions. Has cuff leak w/ 6 portex cuff blown up to 80 cuff pressure.   Plan:   Cont full vent support  Cont vent bundle Will d/w rest of trach team options... Doubt simply changing portex will fix this. 6 Shiley might, but concerned it might not fit  Rest per TRH: tube feeds, abx etc...      PCCM will be available as needed     01/21/2014 10:14 AM

## 2014-01-21 NOTE — Progress Notes (Signed)
I spoke with Ms. Clegg' today briefly but she is still connected to mechanical ventilation and difficult to understand at times. She does tell me she is well today with no complaints. She denies pain and I explained the problems with cellulitis at her PEG site and pointed to the TPN IV back hanging. She shrugs and says "okay." When asked if anything is bothering or worrying her she tells me know. She is pleasant and appears comfortable. Family not at bedside. Please call [phone] for urgent issues over the weekend - otherwise I will follow up on Monday.   Yong Channel, NP Palliative Medicine Team Pager # [phone] (M-F 8a-5p) Team Phone # [phone] (Nights/Weekends)

## 2014-01-21 NOTE — Consult Note (Signed)
WOC is not following this patient for her PEG malfunction and skin care.  I have contacted surgery team for order clarification.   Jonai Weyland [place] RN,CWOCN [phone]

## 2014-01-21 NOTE — Progress Notes (Signed)
No further procedures per palliative care note.  Nothing else to offer.  Local wound care and supportive therapy.

## 2014-01-21 NOTE — Progress Notes (Signed)
Patient ID: Tricia Reed, female   DOB: 1926/10/28, 80 y.o.   MRN: [id number]     [address] [place], [place] [address]    Phone: [phone] FAX: [phone]     Subjective: Pt on vent.  Afebrile.    Objective:  Vital signs:  Filed Vitals:   01/21/14 0300 01/21/14 0421 01/21/14 0436 01/21/14 0500  BP: 151/68 135/74 141/58 129/54  Pulse: 54 85 95 35  Temp:   97.7 F (36.5 C)   TempSrc:   Oral   Resp: '29 21 20 24  ' Height:      Weight:      SpO2: 99% 97% 98% 98%    Last BM Date: 01/20/14  Intake/Output   Yesterday:  09/03 0701 - 09/04 0700 In: 770 [IV Piggyback:200; TPN:570] Out: 600 [Urine:600] This shift: I/O last 3 completed shifts: In: 70 [IV Piggyback:200] Out: 800 [Urine:800]     Physical Exam:  Abdomen: Soft. Nondistended. Moderate amount of gastric secretions, burns and excoriations noted around the site. No evidence of peritonitis. No incarcerated hernias.   Problem List:   Principal Problem:   PEG tube malfunction Active Problems:   Chronic respiratory failure   Tracheostomy in place   Ventilator dependent   Sepsis   CKD (chronic kidney disease)   Hypokalemia   Anemia   Acute on chronic respiratory failure   Palliative care encounter    Results:   Labs: Results for orders placed during the hospital encounter of 01/17/14 (from the past 48 hour(s))  GLUCOSE, CAPILLARY     Status: Abnormal   Collection Time    01/19/14 11:25 AM      Result Value Ref Range   Glucose-Capillary 228 (*) 70 - 99 mg/dL  GLUCOSE, CAPILLARY     Status: Abnormal   Collection Time    01/19/14  3:56 PM      Result Value Ref Range   Glucose-Capillary 213 (*) 70 - 99 mg/dL  GLUCOSE, CAPILLARY     Status: Abnormal   Collection Time    01/19/14  7:43 PM      Result Value Ref Range   Glucose-Capillary 190 (*) 70 - 99 mg/dL   Comment 1 Notify RN    GLUCOSE, CAPILLARY     Status: Abnormal   Collection Time    01/20/14 12:00 AM      Result Value Ref Range   Glucose-Capillary 186 (*) 70 - 99 mg/dL   Comment 1 Notify RN    GLUCOSE, CAPILLARY     Status: Abnormal   Collection Time    01/20/14  1:37 AM      Result Value Ref Range   Glucose-Capillary 149 (*) 70 - 99 mg/dL  GLUCOSE, CAPILLARY     Status: Abnormal   Collection Time    01/20/14  4:12 AM      Result Value Ref Range   Glucose-Capillary 134 (*) 70 - 99 mg/dL   Comment 1 Notify RN    GLUCOSE, CAPILLARY     Status: Abnormal   Collection Time    01/20/14  8:15 AM      Result Value Ref Range   Glucose-Capillary 147 (*) 70 - 99 mg/dL  TRIGLYCERIDES     Status: None   Collection Time    01/20/14 10:52 AM      Result Value Ref Range   Triglycerides 98  <150 mg/dL  MAGNESIUM  Status: None   Collection Time    01/20/14 10:52 AM      Result Value Ref Range   Magnesium 2.5  1.5 - 2.5 mg/dL  PHOSPHORUS     Status: Abnormal   Collection Time    01/20/14 10:52 AM      Result Value Ref Range   Phosphorus 5.4 (*) 2.3 - 4.6 mg/dL  COMPREHENSIVE METABOLIC PANEL     Status: Abnormal   Collection Time    01/20/14 10:52 AM      Result Value Ref Range   Sodium 136 (*) 137 - 147 mEq/L   Potassium 3.3 (*) 3.7 - 5.3 mEq/L   Chloride 98  96 - 112 mEq/L   CO2 24  19 - 32 mEq/L   Glucose, Bld 152 (*) 70 - 99 mg/dL   BUN 80 (*) 6 - 23 mg/dL   Creatinine, Ser 2.52 (*) 0.50 - 1.10 mg/dL   Calcium 7.9 (*) 8.4 - 10.5 mg/dL   Total Protein 5.8 (*) 6.0 - 8.3 g/dL   Albumin 1.1 (*) 3.5 - 5.2 g/dL   AST 21  0 - 37 U/L   ALT 6  0 - 35 U/L   Alkaline Phosphatase 111  39 - 117 U/L   Total Bilirubin 0.2 (*) 0.3 - 1.2 mg/dL   GFR calc non Af Amer 16 (*) >90 mL/min   GFR calc Af Amer 19 (*) >90 mL/min   Comment: (NOTE)     The eGFR has been calculated using the CKD EPI equation.     This calculation has not been validated in all clinical situations.     eGFR's persistently <90 mL/min signify possible Chronic Kidney     Disease.    Anion gap 14  5 - 15  GLUCOSE, CAPILLARY     Status: Abnormal   Collection Time    01/20/14 11:33 AM      Result Value Ref Range   Glucose-Capillary 156 (*) 70 - 99 mg/dL  GLUCOSE, CAPILLARY     Status: Abnormal   Collection Time    01/20/14  4:18 PM      Result Value Ref Range   Glucose-Capillary 144 (*) 70 - 99 mg/dL  GLUCOSE, CAPILLARY     Status: Abnormal   Collection Time    01/20/14  8:59 PM      Result Value Ref Range   Glucose-Capillary 156 (*) 70 - 99 mg/dL  GLUCOSE, CAPILLARY     Status: Abnormal   Collection Time    01/21/14 12:25 AM      Result Value Ref Range   Glucose-Capillary 160 (*) 70 - 99 mg/dL  BASIC METABOLIC PANEL     Status: Abnormal   Collection Time    01/21/14  3:17 AM      Result Value Ref Range   Sodium 132 (*) 137 - 147 mEq/L   Potassium 4.5  3.7 - 5.3 mEq/L   Comment: DELTA CHECK NOTED   Chloride 96  96 - 112 mEq/L   CO2 19  19 - 32 mEq/L   Glucose, Bld 172 (*) 70 - 99 mg/dL   BUN 82 (*) 6 - 23 mg/dL   Creatinine, Ser 2.41 (*) 0.50 - 1.10 mg/dL   Calcium 7.7 (*) 8.4 - 10.5 mg/dL   GFR calc non Af Amer 17 (*) >90 mL/min   GFR calc Af Amer 20 (*) >90 mL/min   Comment: (NOTE)     The eGFR has been calculated  using the CKD EPI equation.     This calculation has not been validated in all clinical situations.     eGFR's persistently <90 mL/min signify possible Chronic Kidney     Disease.   Anion gap 17 (*) 5 - 15  GLUCOSE, CAPILLARY     Status: Abnormal   Collection Time    01/21/14  4:35 AM      Result Value Ref Range   Glucose-Capillary 171 (*) 70 - 99 mg/dL    Imaging / Studies: No results found.  Medications / Allergies:  Scheduled Meds: . chlorhexidine  15 mL Mouth/Throat BID  . DAPTOmycin (CUBICIN)  IV  500 mg Intravenous Q48H  . furosemide  40 mg Intravenous Daily  . heparin subcutaneous  5,000 Units Subcutaneous 3 times per day  . imipenem-cilastatin  250 mg Intravenous Q12H  . insulin aspart  0-9 Units Subcutaneous 6 times  per day  . metoprolol  5 mg Intravenous Q6H  . pantoprazole (PROTONIX) IV  40 mg Intravenous Q24H  . silver sulfADIAZINE   Topical Daily   Continuous Infusions: . TPN (CLINIMIX) Adult without lytes 60 mL/hr at 01/20/14 1729   And  . fat emulsion 250 mL (01/20/14 1729)  . TPN (CLINIMIX) Adult without lytes     And  . fat emulsion     PRN Meds:.  Antibiotics: Anti-infectives   Start     Dose/Rate Route Frequency Ordered Stop   01/20/14 1000  DAPTOmycin (CUBICIN) 500 mg in sodium chloride 0.9 % IVPB     500 mg 220 mL/hr over 30 Minutes Intravenous Every 48 hours 01/18/14 1450     01/18/14 2200  ceFEPIme (MAXIPIME) 1 g in dextrose 5 % 50 mL IVPB  Status:  Discontinued     1 g 100 mL/hr over 30 Minutes Intravenous Every 24 hours 01/17/14 2103 01/17/14 2108   01/18/14 1000  DAPTOmycin (CUBICIN) 500 mg in sodium chloride 0.9 % IVPB  Status:  Discontinued     500 mg 220 mL/hr over 30 Minutes Intravenous Every 24 hours 01/18/14 0904 01/18/14 1450   01/18/14 0000  fluconazole (DIFLUCAN) IVPB 200 mg  Status:  Discontinued     200 mg 100 mL/hr over 60 Minutes Intravenous Every 24 hours 01/17/14 2128 01/18/14 1146   01/17/14 2200  imipenem-cilastatin (PRIMAXIN) 500 mg in sodium chloride 0.9 % 100 mL IVPB  Status:  Discontinued     500 mg 200 mL/hr over 30 Minutes Intravenous 3 times per day 01/17/14 2107 01/17/14 2111   01/17/14 2130  imipenem-cilastatin (PRIMAXIN) 250 mg in sodium chloride 0.9 % 100 mL IVPB     250 mg 200 mL/hr over 30 Minutes Intravenous Every 12 hours 01/17/14 2112     01/17/14 2115  ceFEPIme (MAXIPIME) 2 g in dextrose 5 % 50 mL IVPB  Status:  Discontinued     2 g 100 mL/hr over 30 Minutes Intravenous  Once 01/17/14 2103 01/17/14 2108      Assessment/Plan  PEG site excoriation/ inflammation(placement in stomach verified)  -continue local care with silvadene  -Can consider removing or replacing the PEG in 4 weeks  -not a surgical candidate  -palliative care on  board -will follow back on Tuesday, please call [facility] with questions or concerns.   Erby Pian, [facility] Surgery Pager [phone]) For consults and floor pages call [phone](7A-4:30P)  01/21/2014 7:56 AM

## 2014-01-21 NOTE — Progress Notes (Signed)
STAFF MD Note   - chronic respiratory failure with poor prognosis with 1 year mortality very very very high. She has trach issues but at this point return volumes are good. Dr Tyson Alias of trach team will adddress her trach 01/25/14 Tuesday.rest per NP  Dr. Kalman Shan, M.D., [facility].C.P Pulmonary and Critical Care Medicine Staff Physician [facility] Pager: [phone], If no answer or between  15:00h - 7:00h: call [phone]  01/21/2014 2:56 PM

## 2014-01-21 NOTE — Clinical Social Work Note (Signed)
CSW spoke to [place] at [facility] [phone]) concerning patient.  Misty Stanley reported that there would be a bed available 01/24/14 for the patient.  CSW spoke with the Dr concerning patients discharge disposition and confirmed that the patient would likely be stable enough to discharge at that time.  CSW followed up with [facility] and sent them clinicals concerning the patients TPN so that their pharmacy would be prepared (takes 24 hours to produce the formula).    CSW will continue to follow.  Merlyn Lot, LCSWA Clinical Social Worker [phone]

## 2014-01-21 NOTE — Progress Notes (Signed)
PARENTERAL NUTRITION CONSULT NOTE - FOLLOW UP  Pharmacy Consult:  TPN Indication:  PEG tube dysfunction  No Known Allergies  Patient Measurements: Height:  (157.5 cm) Weight: 187 lb 6.3 oz (85 kg) IBW/kg (Calculated) : 50.1  Vital Signs: Temp: 97.7 F (36.5 C) (09/04 0436) Temp src: Oral (09/04 0436) BP: 129/54 mmHg (09/04 0500) Pulse Rate: 35 (09/04 0500) Intake/Output from previous day: 09/03 0701 - 09/04 0700 In: 770 [IV Piggyback:200; TPN:570] Out: 600 [Urine:600]  Labs: No results found for this basename: WBC, HGB, HCT, PLT, APTT, INR,  in the last 72 hours   Recent Labs  01/19/14 0500 01/20/14 1052 01/21/14 0317  NA 136* 136* 132*  K 4.0 3.3* 4.5  CL 99 98 96  CO2 GLUCOSE 215* 152* 172*  BUN 74* 80* 82*  CREATININE 2.56* 2.52* 2.41*  CALCIUM 7.9* 7.9* 7.7*  MG 2.6* 2.5  --   PHOS 4.7* 5.4*  --   PROT 5.5* 5.8*  --   ALBUMIN 1.1* 1.1*  --   AST 20 21  --   ALT <5 6  --   ALKPHOS 117 111  --   BILITOT 0.2* 0.2*  --   PREALBUMIN 3.2*  --   --   TRIG 132 98  --    Estimated Creatinine Clearance: 17.3 ml/min (by C-G formula based on Cr of 2.41).    Recent Labs  01/20/14 2059 01/21/14 0025 01/21/14 0435  GLUCAP 156* 160* 171*      Insulin Requirements in the past 24 hours:  10 units sensitive SSI  Assessment: 87 YOF admitted with PEG tube failure and started on TPN for nutritional support.  GI: PEG tube has surrounding tissue ulceration - MDs rec EOL discussion. Endo: DM, CBGs 144-171 , on SSI and 20 units insulin in TPN Lytes: low Na, Phos elevated yesterday and lytes removed from TPN, K today 4.5 Renal: gout. Neurogenic bladder with indwelling foley.  CKD - SCr 2.41 Pulm: trach dependent, FiO2 30% - pleural effusions on CT Cards: HTN / CHF - BP soft, HR 35-98, pBNP 17040 - on IV Lopressor Hepatobil: LFTs WNL, TG WNL - liver lesion on CT Neuro: hx CVA / depression ID: Cubicin/Primaxin for cellulitis surrounding PEG tube,  afebrile, WBC elevated at 18.4 on last lab check Best Practices: heparin SQ, CHG , PPI IV TPN Access: triple lumen placed 9/1 TPN day#: 3 (9/1 >> )  Current Nutrition:  TPN  Nutritional Goals:  1500-1600 kCal, 90-100 grams of protein per day   Plan:  - Increase Clinimix 5/15 with NO electrolytes to goal rate of 80 ml/hr + IVFE at 10 ml/hr on MWF.  TPN + lipids MWF will provide 96 gm protein and 1569 KCal average daily. - Daily multivitamin and trace elements - Increase insulin to 25 units/2 L bag - Labs in AM  Talbert Cage, PharmD Pager:  [phone] 01/21/2014, 7:40 AM

## 2014-01-21 NOTE — Progress Notes (Signed)
[facility] PROGRESS NOTE  Tricia Reed UJW:[id number] DOB: 1926/11/27 DOA: 01/17/2014 PCP: No primary provider on file. Interim summary: Tricia Reed is a 80 y.o. Female with prior h/o STEMI, s/p CABG, on trach vent dependent, c diff colitis, DM, CVA, dysphagia s/p PEG tube, anemia, was admitted from [facility] for PEG tube dysfunction. There is continuous leak from the PEG tube with surrounding area of cellulitis. She was started on IV antibiotics with IV Primaxin and IV cubicin. Pulmonary consulted for vent management.   Assessment/Plan:  Cellulitis from the PEG tube site with continuous leak: general surgery on board, patient is not a surgical candidate. No tube feeds. On IV antibiotics for the cellulitis around the peg tube day 5. Plan to complete 7 days of antibiotics.   Mild to moderate malnutrition on TNA: Palliative care consulted and recommendations given. SLP consulted and family wants to continue TNA . No comfort feeds.   Acute renal failure: baseline around 2. Her creatinine is 2.56 with bUN of 74.    Chronic respiratory failure: vent dependent from [facility]. Pulmonary consulted for vent management and recommendations given.     Code Status: Partial CODE.  Family Communication: discussed with son over the phone on 9/2.  Disposition Plan: pending.    Consultants:  Pulmonary  ID  Surgery.   pallIATIVE CARE   Procedures:  CT abdomen and pelvis  Left subclavian central line 9/1  CXR  Antibiotics:  cubicin  Fluconazole  Primaxin.   HPI/Subjective: Comfortable.   Objective: Filed Vitals:   01/21/14 1552  BP: 141/51  Pulse: 80  Temp:   Resp:     Intake/Output Summary (Last 24 hours) at 01/21/14 1610 Last data filed at 01/21/14 1500  Gross per 24 hour  Intake 1756.17 ml  Output   1400 ml  Net 356.17 ml   Filed Weights   01/17/14 2200 01/20/14 0402  Weight: 85.7 kg (188 lb 15 oz) 85 kg (187 lb 6.3 oz)     Exam:   General:  Alert afebrile comfortable  Cardiovascular: s1s2  Respiratory: diminished air entry at bases.   Abdomen: soft, PEG in place with surrounding white based ulcers and erythema.   Musculoskeletal: no edema or cyanosis.   Data Reviewed: Basic Metabolic Panel:  Recent Labs Lab 01/17/14 1827 01/17/14 1836 01/18/14 0406 01/18/14 0407 01/19/14 0500 01/20/14 1052 01/21/14 0317  NA  --  134* 137  --  136* 136* 132*  K  --  2.9* 3.2*  --  4.0 3.3* 4.5  CL  --  98 97  --  99 98 96  CO2  --   --  27  --  GLUCOSE  --  146* 124*  --  215* 152* 172*  BUN  --  63* 73*  --  74* 80* 82*  CREATININE  --  2.60* 2.44*  --  2.56* 2.52* 2.41*  CALCIUM  --   --  7.9*  --  7.9* 7.9* 7.7*  MG 2.5  --   --  2.5 2.6* 2.5  --   PHOS  --   --   --  4.3 4.7* 5.4*  --    Liver Function Tests:  Recent Labs Lab 01/18/14 0406 01/19/14 0500 01/20/14 1052  AST ALT 7 <5 6  ALKPHOS 133* 117 111  BILITOT 0.2* 0.2* 0.2*  PROT 5.4* 5.5* 5.8*  ALBUMIN 1.1* 1.1* 1.1*   No results found for this basename: LIPASE,  AMYLASE,  in the last 168 hours No results found for this basename: AMMONIA,  in the last 168 hours CBC:  Recent Labs Lab 01/17/14 1827 01/17/14 1836 01/18/14 0406  WBC 24.4*  --  18.4*  NEUTROABS 21.4*  --   --   HGB 8.6* 9.2* 9.5*  HCT 26.2* 27.0* 28.4*  MCV 83.4  --  83.3  PLT 346  --  291   Cardiac Enzymes:  Recent Labs Lab 01/17/14 1827 01/17/14 2221 01/18/14 0407 01/18/14 1345  CKTOTAL 32  --   --  47  TROPONINI  --  <0.30 <0.30 <0.30   BNP (last 3 results)  Recent Labs  01/18/14 0407  PROBNP 17040.0*   CBG:  Recent Labs Lab 01/20/14 2059 01/21/14 0025 01/21/14 0435 01/21/14 0839 01/21/14 1144  GLUCAP 156* 160* 171* 178* 234*    Recent Results (from the past 240 hour(s))  MRSA PCR SCREENING     Status: None   Collection Time    01/17/14 11:08 PM      Result Value Ref Range Status   MRSA by PCR NEGATIVE   NEGATIVE Final   Comment:            The GeneXpert MRSA Assay (FDA     approved for NASAL specimens     only), is one component of a     comprehensive MRSA colonization     surveillance program. It is not     intended to diagnose MRSA     infection nor to guide or     monitor treatment for     MRSA infections.  CLOSTRIDIUM DIFFICILE BY PCR     Status: None   Collection Time    01/18/14  3:04 PM      Result Value Ref Range Status   C difficile by pcr NEGATIVE  NEGATIVE Final     Studies: No results found.  Scheduled Meds: . chlorhexidine  15 mL Mouth/Throat BID  . DAPTOmycin (CUBICIN)  IV  500 mg Intravenous Q48H  . furosemide  40 mg Intravenous Daily  . heparin subcutaneous  5,000 Units Subcutaneous 3 times per day  . imipenem-cilastatin  250 mg Intravenous Q12H  . insulin aspart  0-9 Units Subcutaneous 6 times per day  . metoprolol  5 mg Intravenous Q6H  . pantoprazole (PROTONIX) IV  40 mg Intravenous Q24H  . silver sulfADIAZINE   Topical Daily   Continuous Infusions: . TPN (CLINIMIX) Adult without lytes 60 mL/hr at 01/20/14 1729   And  . fat emulsion 250 mL (01/20/14 1729)  . TPN (CLINIMIX) Adult without lytes     And  . fat emulsion      Principal Problem:   PEG tube malfunction Active Problems:   Chronic respiratory failure   Tracheostomy in place   Ventilator dependent   Sepsis   CKD (chronic kidney disease)   Hypokalemia   Anemia   Acute on chronic respiratory failure   Palliative care encounter    Time spent: 35 minutes.     [facility]  [facility] Pager [phone] If 7PM-7AM, please contact night-coverage at [email or web address], password [facility] 01/21/2014, 4:10 PM  LOS: 4 days

## 2014-01-21 NOTE — Progress Notes (Signed)
Speech Language Pathology     Patient Details Name: Tricia Reed MRN: [id number] DOB: 1926/09/18 Today's Date: 01/21/2014 Time:  -    This SLP reviewed chart.  Palliative care discussed with pt.'s son who does not wish to pursue comfort feeds at this time due to aspiration risk.  I agree with Palliative care that a FEES assessment may not be appropriate at this time.  Pt. Currently receiving TPN.  SLP will sign off at this time and if pt./family desires to have objective assessment of swallow, then please reconsult.        Royce Macadamia 01/21/2014, 1:08 PM Breck Coons Lonell Face.Ed [employer] [phone]

## 2014-01-22 LAB — BASIC METABOLIC PANEL
Anion gap: 15 (ref 5–15)
BUN: 84 mg/dL — AB (ref 6–23)
CO2: 19 mEq/L (ref 19–32)
CREATININE: 2.11 mg/dL — AB (ref 0.50–1.10)
Calcium: 7.9 mg/dL — ABNORMAL LOW (ref 8.4–10.5)
Chloride: 97 mEq/L (ref 96–112)
GFR, EST AFRICAN AMERICAN: 23 mL/min — AB (ref >90)
GFR, EST NON AFRICAN AMERICAN: 20 mL/min — AB (ref >90)
GLUCOSE: 185 mg/dL — AB (ref 70–99)
Potassium: 2.9 mEq/L — CL (ref 3.7–5.3)
Sodium: 131 mEq/L — ABNORMAL LOW (ref 137–147)

## 2014-01-22 LAB — CBC
HEMATOCRIT: 27.2 % — AB (ref 36.0–46.0)
Hemoglobin: 8.8 g/dL — ABNORMAL LOW (ref 12.0–15.0)
MCH: 28 pg (ref 26.0–34.0)
MCHC: 32.4 g/dL (ref 30.0–36.0)
MCV: 86.6 fL (ref 78.0–100.0)
PLATELETS: 317 10*3/uL (ref 150–400)
RBC: 3.14 MIL/uL — ABNORMAL LOW (ref 3.87–5.11)
RDW: 16.6 % — ABNORMAL HIGH (ref 11.5–15.5)
WBC: 23 10*3/uL — AB (ref 4.0–10.5)

## 2014-01-22 LAB — GLUCOSE, CAPILLARY
GLUCOSE-CAPILLARY: 176 mg/dL — AB (ref 70–99)
Glucose-Capillary: 178 mg/dL — ABNORMAL HIGH (ref 70–99)
Glucose-Capillary: 179 mg/dL — ABNORMAL HIGH (ref 70–99)
Glucose-Capillary: 184 mg/dL — ABNORMAL HIGH (ref 70–99)
Glucose-Capillary: 186 mg/dL — ABNORMAL HIGH (ref 70–99)
Glucose-Capillary: 197 mg/dL — ABNORMAL HIGH (ref 70–99)

## 2014-01-22 LAB — PHOSPHORUS: Phosphorus: 4 mg/dL (ref 2.3–4.6)

## 2014-01-22 MED ORDER — TRACE MINERALS CR-CU-F-FE-I-MN-MO-SE-ZN IV SOLN
INTRAVENOUS | Status: AC
  Administered 2014-01-22: 17:00:00 via INTRAVENOUS
  Filled 2014-01-22: qty 2000

## 2014-01-22 MED ORDER — TRACE MINERALS CR-CU-F-FE-I-MN-MO-SE-ZN IV SOLN
INTRAVENOUS | Status: DC
  Filled 2014-01-22: qty 2000

## 2014-01-22 MED ORDER — POTASSIUM CHLORIDE 10 MEQ/50ML IV SOLN
10.0000 meq | INTRAVENOUS | Status: AC
  Administered 2014-01-22 (×5): 10 meq via INTRAVENOUS
  Filled 2014-01-22 (×5): qty 50

## 2014-01-22 MED ORDER — INSULIN ASPART 100 UNIT/ML ~~LOC~~ SOLN
0.0000 [IU] | SUBCUTANEOUS | Status: DC
  Administered 2014-01-22 (×3): 3 [IU] via SUBCUTANEOUS
  Administered 2014-01-22 – 2014-01-23 (×2): 2 [IU] via SUBCUTANEOUS
  Administered 2014-01-23 (×5): 3 [IU] via SUBCUTANEOUS
  Administered 2014-01-24: 2 [IU] via SUBCUTANEOUS
  Administered 2014-01-24: 3 [IU] via SUBCUTANEOUS
  Administered 2014-01-24 – 2014-01-25 (×3): 2 [IU] via SUBCUTANEOUS

## 2014-01-22 NOTE — Progress Notes (Addendum)
PARENTERAL NUTRITION CONSULT NOTE - FOLLOW UP  Pharmacy Consult:  TPN Indication:  PEG tube dysfunction  No Known Allergies  Patient Measurements: Height:  (157.5 cm) Weight: 187 lb 6.3 oz (85 kg) IBW/kg (Calculated) : 50.1  Vital Signs: Temp: 98.1 F (36.7 C) (09/05 0432) Temp src: Oral (09/05 0432) BP: 154/82 mmHg (09/05 0432) Pulse Rate: 88 (09/05 0432) Intake/Output from previous day: 09/04 0701 - 09/05 0700 In: 1766.2 [IV Piggyback:100; ZOX:0960.4] Out: 2000 [Urine:2000]  Labs:  Recent Labs  01/22/14 0612  WBC 23.0*  HGB 8.8*  HCT 27.2*  PLT 317     Recent Labs  01/20/14 1052 01/21/14 0317 01/22/14 0612  NA 136* 132* 131*  K 3.3* 4.5 2.9*  CL 98 96 97  CO2 GLUCOSE 152* 172* 185*  BUN 80* 82* 84*  CREATININE 2.52* 2.41* 2.11*  CALCIUM 7.9* 7.7* 7.9*  MG 2.5  --   --   PHOS 5.4*  --  4.0  PROT 5.8*  --   --   ALBUMIN 1.1*  --   --   AST 21  --   --   ALT 6  --   --   ALKPHOS 111  --   --   BILITOT 0.2*  --   --   TRIG 98  --   --    Estimated Creatinine Clearance: 19.7 ml/min (by C-G formula based on Cr of 2.11).    Recent Labs  01/21/14 2011 01/22/14 0037 01/22/14 0411  GLUCAP 189* 178* 186*      Insulin Requirements in the past 24 hours:  11 units sensitive SSI  Assessment: 87 YOF admitted with PEG tube failure and started on TPN for nutritional support.  GI: PEG tube has surrounding tissue ulceration - MDs rec EOL discussion. Endo: DM, CBGs 178-193 , on SSI and 25 units insulin in TPN Lytes: low Na, K 2.9  Lytes were removed from TPN due to elevated phos, Phos today 4.0, CoCa 10.22 Renal: gout. Neurogenic bladder with indwelling foley.  CKD - SCr 2.11  IV lasix 40 daily Pulm: trach dependent, FiO2 30% - pleural effusions on CT Cards: HTN / CHF - BP elevated, HR wnl , pBNP 17040 - on IV Lopressor Hepatobil: LFTs WNL, TG WNL - liver lesion on CT Neuro: hx CVA / depression ID: Cubicin for cellulitis surrounding PEG  tube, afebrile, WBC elevated at 23.0  Best Practices: heparin SQ, CHG , PPI IV TPN Access: triple lumen placed 9/1 TPN day#: 4 (9/1 >> )  Current Nutrition:  TPN  Nutritional Goals:  1500-1600 kCal, 90-100 grams of protein per day   Plan:  - Change Clinimix 5/15 to add electrolytes at goal rate of 80 ml/hr + IVFE at 10 ml/hr on MWF.  TPN + lipids MWF will provide 96 gm protein and 1569 KCal average daily. - Daily multivitamin and trace elements -KCl 10 mEq IV X 5 - Increase insulin to 30 units/2 L bag -Change ssi to moderate coverage - Labs in AM  Talbert Cage, PharmD Pager:  [phone] 01/22/2014, 7:23 AM

## 2014-01-22 NOTE — Progress Notes (Signed)
CRITICAL VALUE ALERT  Critical value received:  K 2.9  Date of notification:  01/22/2014  Time of notification:  0720  Critical value read back:Yes.    Nurse who received alert:  Nickolas Madrid, RN  MD notified (1st page):  Blake Divine  Time of first page:  [phone]  MD notified (2nd page):  Time of second page:  Responding MD: Blake Divine  Time MD responded:  [phone]

## 2014-01-22 NOTE — Progress Notes (Addendum)
[facility] PROGRESS NOTE  Tricia Reed ZOX:[id number] DOB: 1927-04-23 DOA: 01/17/2014 PCP: No primary provider on file. Interim summary: Tricia Reed is a 80 y.o. Female with prior h/o STEMI, s/p CABG, on trach vent dependent, c diff colitis, DM, CVA, dysphagia s/p PEG tube, anemia, was admitted from [facility] for PEG tube dysfunction. There is continuous leak from the PEG tube with surrounding area of cellulitis. She was started on IV antibiotics with IV Primaxin and IV cubicin. Pulmonary consulted for vent management.   Assessment/Plan:  Cellulitis from the PEG tube site with continuous leak: general surgery on board, patient is not a surgical candidate. No tube feeds. On IV antibiotics for the cellulitis around the peg tube day 6. Plan to complete 7 days of antibiotics.   Mild to moderate malnutrition on TNA: Palliative care consulted and recommendations given. SLP consulted and family wants to continue TNA . No comfort feeds.   Acute renal failure: baseline around 2. Her creatinine is 2.56 with bUN of 74.    Chronic respiratory failure: vent dependent from [facility]. Pulmonary consulted for vent management and recommendations given.   Hypokalemia: replete as needed.     Code Status: Partial CODE.  Family Communication: discussed with son over the phone. Disposition Plan: pending.    Consultants:  Pulmonary  ID  Surgery.   pallIATIVE CARE   Procedures:  CT abdomen and pelvis  Left subclavian central line 9/1  CXR  Antibiotics:  cubicin  Fluconazole  Primaxin.   HPI/Subjective: Comfortable. Reports some pain in the knees , probably arthritic pain.   Objective: Filed Vitals:   01/22/14 1111  BP: 153/60  Pulse: 89  Temp: 97.7 F (36.5 C)  Resp: 38    Intake/Output Summary (Last 24 hours) at 01/22/14 1522 Last data filed at 01/22/14 1400  Gross per 24 hour  Intake    340 ml  Output   1650 ml  Net  -1310 ml   Filed Weights    01/17/14 2200 01/20/14 0402  Weight: 85.7 kg (188 lb 15 oz) 85 kg (187 lb 6.3 oz)    Exam:   General:  Alert afebrile comfortable  Cardiovascular: s1s2  Respiratory: diminished air entry at bases.   Abdomen: soft, PEG in place with surrounding white based ulcers and erythema.   Musculoskeletal: no edema or cyanosis. Tender knees.   Data Reviewed: Basic Metabolic Panel:  Recent Labs Lab 01/17/14 1827  01/18/14 0406 01/18/14 0407 01/19/14 0500 01/20/14 1052 01/21/14 0317 01/22/14 0612  NA  --   < > 137  --  136* 136* 132* 131*  K  --   < > 3.2*  --  4.0 3.3* 4.5 2.9*  CL  --   < > 97  --  99 98 96 97  CO2  --   --  27  --  GLUCOSE  --   < > 124*  --  215* 152* 172* 185*  BUN  --   < > 73*  --  74* 80* 82* 84*  CREATININE  --   < > 2.44*  --  2.56* 2.52* 2.41* 2.11*  CALCIUM  --   --  7.9*  --  7.9* 7.9* 7.7* 7.9*  MG 2.5  --   --  2.5 2.6* 2.5  --   --   PHOS  --   --   --  4.3 4.7* 5.4*  --  4.0  < > = values in this  interval not displayed. Liver Function Tests:  Recent Labs Lab 01/18/14 0406 01/19/14 0500 01/20/14 1052  AST ALT 7 <5 6  ALKPHOS 133* 117 111  BILITOT 0.2* 0.2* 0.2*  PROT 5.4* 5.5* 5.8*  ALBUMIN 1.1* 1.1* 1.1*   No results found for this basename: LIPASE, AMYLASE,  in the last 168 hours No results found for this basename: AMMONIA,  in the last 168 hours CBC:  Recent Labs Lab 01/17/14 1827 01/17/14 1836 01/18/14 0406 01/22/14 0612  WBC 24.4*  --  18.4* 23.0*  NEUTROABS 21.4*  --   --   --   HGB 8.6* 9.2* 9.5* 8.8*  HCT 26.2* 27.0* 28.4* 27.2*  MCV 83.4  --  83.3 86.6  PLT 346  --  291 317   Cardiac Enzymes:  Recent Labs Lab 01/17/14 1827 01/17/14 2221 01/18/14 0407 01/18/14 1345  CKTOTAL 32  --   --  47  TROPONINI  --  <0.30 <0.30 <0.30   BNP (last 3 results)  Recent Labs  01/18/14 0407  PROBNP 17040.0*   CBG:  Recent Labs Lab 01/21/14 2011 01/22/14 0037 01/22/14 0411 01/22/14 0758  01/22/14 1116  GLUCAP 189* 178* 186* 184* 197*    Recent Results (from the past 240 hour(s))  MRSA PCR SCREENING     Status: None   Collection Time    01/17/14 11:08 PM      Result Value Ref Range Status   MRSA by PCR NEGATIVE  NEGATIVE Final   Comment:            The GeneXpert MRSA Assay (FDA     approved for NASAL specimens     only), is one component of a     comprehensive MRSA colonization     surveillance program. It is not     intended to diagnose MRSA     infection nor to guide or     monitor treatment for     MRSA infections.  CLOSTRIDIUM DIFFICILE BY PCR     Status: None   Collection Time    01/18/14  3:04 PM      Result Value Ref Range Status   C difficile by pcr NEGATIVE  NEGATIVE Final     Studies: No results found.  Scheduled Meds: . chlorhexidine  15 mL Mouth/Throat BID  . DAPTOmycin (CUBICIN)  IV  500 mg Intravenous Q48H  . furosemide  40 mg Intravenous Daily  . heparin subcutaneous  5,000 Units Subcutaneous 3 times per day  . imipenem-cilastatin  250 mg Intravenous Q12H  . insulin aspart  0-15 Units Subcutaneous 6 times per day  . metoprolol  5 mg Intravenous Q6H  . pantoprazole (PROTONIX) IV  40 mg Intravenous Q24H  . silver sulfADIAZINE   Topical Daily   Continuous Infusions: . Marland KitchenTPN (CLINIMIX-E) Adult    . TPN (CLINIMIX) Adult without lytes 80 mL/hr at 01/21/14 1734   And  . fat emulsion 250 mL (01/21/14 1735)    Principal Problem:   PEG tube malfunction Active Problems:   Chronic respiratory failure   Tracheostomy in place   Ventilator dependent   Sepsis   CKD (chronic kidney disease)   Hypokalemia   Anemia   Acute on chronic respiratory failure   Palliative care encounter    Time spent: 35 minutes.     [facility]  [facility] Pager [phone] If 7PM-7AM, please contact night-coverage at [email or web address], password [facility] 01/22/2014, 3:22 PM  LOS: 5 days

## 2014-01-23 DIAGNOSIS — E43 Unspecified severe protein-calorie malnutrition: Secondary | ICD-10-CM

## 2014-01-23 LAB — GLUCOSE, CAPILLARY
GLUCOSE-CAPILLARY: 164 mg/dL — AB (ref 70–99)
Glucose-Capillary: 139 mg/dL — ABNORMAL HIGH (ref 70–99)
Glucose-Capillary: 158 mg/dL — ABNORMAL HIGH (ref 70–99)
Glucose-Capillary: 161 mg/dL — ABNORMAL HIGH (ref 70–99)
Glucose-Capillary: 181 mg/dL — ABNORMAL HIGH (ref 70–99)
Glucose-Capillary: 190 mg/dL — ABNORMAL HIGH (ref 70–99)

## 2014-01-23 LAB — COMPREHENSIVE METABOLIC PANEL
ALBUMIN: 1.1 g/dL — AB (ref 3.5–5.2)
ALT: 6 U/L (ref 0–35)
AST: 25 U/L (ref 0–37)
Alkaline Phosphatase: 107 U/L (ref 39–117)
Anion gap: 13 (ref 5–15)
BUN: 86 mg/dL — ABNORMAL HIGH (ref 6–23)
CALCIUM: 8 mg/dL — AB (ref 8.4–10.5)
CO2: 22 mEq/L (ref 19–32)
Chloride: 98 mEq/L (ref 96–112)
Creatinine, Ser: 1.91 mg/dL — ABNORMAL HIGH (ref 0.50–1.10)
GFR calc Af Amer: 26 mL/min — ABNORMAL LOW (ref >90)
GFR calc non Af Amer: 23 mL/min — ABNORMAL LOW (ref >90)
Glucose, Bld: 171 mg/dL — ABNORMAL HIGH (ref 70–99)
Potassium: 3.1 mEq/L — ABNORMAL LOW (ref 3.7–5.3)
Sodium: 133 mEq/L — ABNORMAL LOW (ref 137–147)
Total Bilirubin: 0.3 mg/dL (ref 0.3–1.2)
Total Protein: 5.9 g/dL — ABNORMAL LOW (ref 6.0–8.3)

## 2014-01-23 LAB — PHOSPHORUS: PHOSPHORUS: 4.2 mg/dL (ref 2.3–4.6)

## 2014-01-23 LAB — MAGNESIUM: MAGNESIUM: 1.7 mg/dL (ref 1.5–2.5)

## 2014-01-23 MED ORDER — TRACE MINERALS CR-CU-F-FE-I-MN-MO-SE-ZN IV SOLN
INTRAVENOUS | Status: AC
  Administered 2014-01-23: 18:00:00 via INTRAVENOUS
  Filled 2014-01-23: qty 2000

## 2014-01-23 MED ORDER — POTASSIUM CHLORIDE 10 MEQ/50ML IV SOLN
10.0000 meq | INTRAVENOUS | Status: AC
  Administered 2014-01-23 (×4): 10 meq via INTRAVENOUS
  Filled 2014-01-23 (×4): qty 50

## 2014-01-23 NOTE — Progress Notes (Signed)
ANTIBIOTIC CONSULT NOTE - FOLLOW UP  Pharmacy Consult for Daptomycin and Primaxin Indication: cellulitis around PEG tube site  No Known Allergies  Patient Measurements: Height:  (157.5 cm) Weight: 187 lb 6.3 oz (85 kg) IBW/kg (Calculated) : 50.1  Vital Signs: Temp: 97.8 F (36.6 C) (09/06 1420) Temp src: Axillary (09/06 1420) BP: 126/62 mmHg (09/06 1420) Pulse Rate: 93 (09/06 1420) Intake/Output from previous day: 09/05 0701 - 09/06 0700 In: 1210 [IV Piggyback:410; TPN:800] Out: 1450 [Urine:1450]  Labs:  Recent Labs  01/21/14 0317 01/22/14 0612 01/23/14 0335  WBC  --  23.0*  --   HGB  --  8.8*  --   PLT  --  317  --   CREATININE 2.41* 2.11* 1.91*   Estimated Creatinine Clearance: 21.8 ml/min (by C-G formula based on Cr of 1.91).  Assessment:   Hx multiple courses of antibioitcs for several drug-resistant organisims.  Day # 6 Primaxin 250 mg IV q12hrs and day # 25 Daptomycin (begun on 12/30/13 at [facility]), currently on 500 mg IV q48hrs.  Antibiotics may stop on 9/7 after 7-day course this admission.   Last CK = 47 on 01/18/14.  For weekly checks while on Daptomycin. Creatinine has trended down to baseline. BUN trending up some. I/O -240 ml yesterday.  Afebrile, WBC 23.0 on 01/22/14.  Goal of Therapy:  appropriate Primaxin and Daptomycin doses for renal function and infection  Plan:   Continue Primaxin 250 mg IV q12hrs and Daptomycin 500 mg IV q48hrs.   Weekly CK already ordered for 01/24/14.  Will follow up renal function, CBC, progress.  Will follow up for length of therapy.  Dennie Fetters, [place] Pager: [phone] 01/23/2014,2:44 PM

## 2014-01-23 NOTE — Progress Notes (Signed)
[facility] PROGRESS NOTE  Tricia Reed WJX:[id number] DOB: 12-31-1926 DOA: 01/17/2014 PCP: No primary provider on file. Interim summary: Tricia Reed is a 80 y.o. Female with prior h/o STEMI, s/p CABG, on trach vent dependent, c diff colitis, DM, CVA, dysphagia s/p PEG tube, anemia, was admitted from [facility] for PEG tube dysfunction. There is continuous leak from the PEG tube with surrounding area of cellulitis. She was started on IV antibiotics with IV Primaxin and IV cubicin. Pulmonary consulted for vent management.   Assessment/Plan:  Cellulitis from the PEG tube site with continuous leak: general surgery on board, patient is not a surgical candidate. No tube feeds. On IV antibiotics for the cellulitis around the peg tube.  Mild to moderate malnutrition on TNA: Palliative care consulted and recommendations given. SLP consulted and family wants to continue TNA . No comfort feeds.   Acute renal failure: baseline around 2. Her creatinine is 2.56 with bUN of 74.    Chronic respiratory failure: vent dependent from [facility]. Pulmonary consulted for vent management and recommendations given.   Hypokalemia: replete as needed.     Code Status: Partial CODE.  Family Communication: discussed with son over the phone. Disposition Plan: pending.    Consultants:  Pulmonary  ID  Surgery.   pallIATIVE CARE   Procedures:  CT abdomen and pelvis  Left subclavian central line 9/1  CXR  Antibiotics:  cubicin  Fluconazole  Primaxin.   HPI/Subjective: Comfortable. Reports some pain in the knees , probably arthritic pain.   Objective: Filed Vitals:   01/23/14 1420  BP: 126/62  Pulse: 93  Temp: 97.8 F (36.6 C)  Resp: 16    Intake/Output Summary (Last 24 hours) at 01/23/14 1612 Last data filed at 01/23/14 0400  Gross per 24 hour  Intake   1100 ml  Output   1000 ml  Net    100 ml   Filed Weights   01/17/14 2200 01/20/14 0402  Weight: 85.7 kg  (188 lb 15 oz) 85 kg (187 lb 6.3 oz)    Exam:   General:  Alert afebrile comfortable  Cardiovascular: s1s2  Respiratory: diminished air entry at bases.   Abdomen: soft, PEG in place with surrounding white based ulcers and erythema.   Musculoskeletal: no edema or cyanosis. Tender knees.   Data Reviewed: Basic Metabolic Panel:  Recent Labs Lab 01/17/14 1827  01/18/14 0407 01/19/14 0500 01/20/14 1052 01/21/14 0317 01/22/14 0612 01/23/14 0335  NA  --   < >  --  136* 136* 132* 131* 133*  K  --   < >  --  4.0 3.3* 4.5 2.9* 3.1*  CL  --   < >  --  99 98 96 97 98  CO2  --   < >  --  GLUCOSE  --   < >  --  215* 152* 172* 185* 171*  BUN  --   < >  --  74* 80* 82* 84* 86*  CREATININE  --   < >  --  2.56* 2.52* 2.41* 2.11* 1.91*  CALCIUM  --   < >  --  7.9* 7.9* 7.7* 7.9* 8.0*  MG 2.5  --  2.5 2.6* 2.5  --   --  1.7  PHOS  --   --  4.3 4.7* 5.4*  --  4.0 4.2  < > = values in this interval not displayed. Liver Function Tests:  Recent Labs Lab 01/18/14 0406 01/19/14  0500 01/20/14 1052 01/23/14 0335  AST ALT 7 5 6 6   ALKPHOS 133* 117 111 107  BILITOT 0.2* 0.2* 0.2* 0.3  PROT 5.4* 5.5* 5.8* 5.9*  ALBUMIN 1.1* 1.1* 1.1* 1.1*   No results found for this basename: LIPASE, AMYLASE,  in the last 168 hours No results found for this basename: AMMONIA,  in the last 168 hours CBC:  Recent Labs Lab 01/17/14 1827 01/17/14 1836 01/18/14 0406 01/22/14 0612  WBC 24.4*  --  18.4* 23.0*  NEUTROABS 21.4*  --   --   --   HGB 8.6* 9.2* 9.5* 8.8*  HCT 26.2* 27.0* 28.4* 27.2*  MCV 83.4  --  83.3 86.6  PLT 346  --  291 317   Cardiac Enzymes:  Recent Labs Lab 01/17/14 1827 01/17/14 2221 01/18/14 0407 01/18/14 1345  CKTOTAL 32  --   --  47  TROPONINI  --  <0.30 <0.30 <0.30   BNP (last 3 results)  Recent Labs  01/18/14 0407  PROBNP 17040.0*   CBG:  Recent Labs Lab 01/22/14 2025 01/23/14 0003 01/23/14 0355 01/23/14 0754  01/23/14 1254  GLUCAP 179* 190* 164* 161* 181*    Recent Results (from the past 240 hour(s))  MRSA PCR SCREENING     Status: None   Collection Time    01/17/14 11:08 PM      Result Value Ref Range Status   MRSA by PCR NEGATIVE  NEGATIVE Final   Comment:            The GeneXpert MRSA Assay (FDA     approved for NASAL specimens     only), is one component of a     comprehensive MRSA colonization     surveillance program. It is not     intended to diagnose MRSA     infection nor to guide or     monitor treatment for     MRSA infections.  CLOSTRIDIUM DIFFICILE BY PCR     Status: None   Collection Time    01/18/14  3:04 PM      Result Value Ref Range Status   C difficile by pcr NEGATIVE  NEGATIVE Final     Studies: No results found.  Scheduled Meds: . chlorhexidine  15 mL Mouth/Throat BID  . DAPTOmycin (CUBICIN)  IV  500 mg Intravenous Q48H  . furosemide  40 mg Intravenous Daily  . heparin subcutaneous  5,000 Units Subcutaneous 3 times per day  . imipenem-cilastatin  250 mg Intravenous Q12H  . insulin aspart  0-15 Units Subcutaneous 6 times per day  . metoprolol  5 mg Intravenous Q6H  . pantoprazole (PROTONIX) IV  40 mg Intravenous Q24H  . silver sulfADIAZINE   Topical Daily   Continuous Infusions: . Marland KitchenTPN (CLINIMIX-E) Adult 80 mL/hr at 01/22/14 1718  . Marland KitchenTPN (CLINIMIX-E) Adult      Principal Problem:   PEG tube malfunction Active Problems:   Chronic respiratory failure   Tracheostomy in place   Ventilator dependent   Sepsis   CKD (chronic kidney disease)   Hypokalemia   Anemia   Acute on chronic respiratory failure   Palliative care encounter    Time spent: 35 minutes.     [facility]  [facility] Pager [phone] If 7PM-7AM, please contact night-coverage at [email or web address], password [facility] 01/23/2014, 4:12 PM  LOS: 6 days

## 2014-01-23 NOTE — Progress Notes (Signed)
PARENTERAL NUTRITION CONSULT NOTE - FOLLOW UP  Pharmacy Consult:  TPN Indication:  PEG tube dysfunction  No Known Allergies  Patient Measurements: Height:  (157.5 cm) Weight: 187 lb 6.3 oz (85 kg) IBW/kg (Calculated) : 50.1  Vital Signs: Temp: 98 F (36.7 C) (09/06 0356) Temp src: Axillary (09/06 0356) BP: 144/69 mmHg (09/06 0356) Pulse Rate: 105 (09/06 0356) Intake/Output from previous day: 09/05 0701 - 09/06 0700 In: 1210 [IV Piggyback:410; TPN:800] Out: 1450 [Urine:1450]  Labs:  Recent Labs  01/22/14 0612  WBC 23.0*  HGB 8.8*  HCT 27.2*  PLT 317     Recent Labs  01/20/14 1052 01/21/14 0317 01/22/14 0612 01/23/14 0335  NA 136* 132* 131* 133*  K 3.3* 4.5 2.9* 3.1*  CL 98 96 97 98  CO2 GLUCOSE 152* 172* 185* 171*  BUN 80* 82* 84* 86*  CREATININE 2.52* 2.41* 2.11* 1.91*  CALCIUM 7.9* 7.7* 7.9* 8.0*  MG 2.5  --   --  1.7  PHOS 5.4*  --  4.0 4.2  PROT 5.8*  --   --  5.9*  ALBUMIN 1.1*  --   --  1.1*  AST 21  --   --  25  ALT 6  --   --  6  ALKPHOS 111  --   --  107  BILITOT 0.2*  --   --  0.3  TRIG 98  --   --   --    Estimated Creatinine Clearance: 21.8 ml/min (by C-G formula based on Cr of 1.91).    Recent Labs  01/22/14 2025 01/23/14 0003 01/23/14 0355  GLUCAP 179* 190* 164*      Insulin Requirements in the past 24 hours:  14 units sensitive SSI  Assessment: 87 YOF admitted with PEG tube failure and started on TPN for nutritional support.  GI: PEG tube has surrounding tissue ulceration - MDs rec EOL discussion. Endo: DM, CBGs 164-197 , on SSI and 30 units insulin in TPN Lytes: low Na, K 3.1  Lytes were removed from TPN due to elevated phos previously, Phos today 4.2, CoCa 10.32 Renal: gout. Neurogenic bladder with indwelling foley.  CKD - SCr 1.91  IV lasix 40 daily Pulm: trach dependent, FiO2 30% - pleural effusions on CT Cards: HTN / CHF - BP elevated, HR elevated, pBNP 17040 - on IV Lopressor Hepatobil: LFTs WNL,  TG WNL - liver lesion on CT Neuro: hx CVA / depression ID: Cubicin for cellulitis surrounding PEG tube, afebrile, WBC elevated at 23.0  Best Practices: heparin SQ, CHG , PPI IV TPN Access: triple lumen placed 9/1 TPN day#: 5 (9/1 >> )  Current Nutrition:  TPN  Nutritional Goals:  1500-1600 kCal, 90-100 grams of protein per day   Plan:  - Cont Clinimix 5/15 with electrolytes at goal rate of 80 ml/hr + IVFE at 10 ml/hr on MWF.  TPN + lipids MWF will provide 96 gm protein and 1569 KCal average daily. - Daily multivitamin and trace elements -KCl 10 mEq IV X 4 - Increase insulin to 40 units/2 L bag -Cont ssi to moderate coverage - Labs in AM  Talbert Cage, PharmD Pager:  [phone] 01/23/2014, 7:18 AM

## 2014-01-23 NOTE — Progress Notes (Signed)
[facility] PROGRESS NOTE  Liisa Picone WUJ:[id number] DOB: [birth year] DOA: 01/17/2014 PCP: No primary provider on file. Interim summary: Tricia Reed is a 80 y.o. Female with prior h/o STEMI, s/p CABG, on trach vent dependent, c diff colitis, DM, CVA, dysphagia s/p PEG tube, anemia, was admitted from [facility] for [facility] tube dysfunction. There is continuous leak from the PEG tube with surrounding area of cellulitis. She was started on IV antibiotics with IV Primaxin and IV cubicin. Pulmonary consulted for vent management.   Assessment/Plan:  Cellulitis from the PEG tube site with continuous leak: general surgery on board, patient is not a surgical candidate. No tube feeds. On IV antibiotics for the cellulitis around the peg tube. Plan to complete 7 days of antibiotics.   Mild to moderate malnutrition on TNA: Palliative care consulted and recommendations given. SLP consulted and family wants to continue TNA . No comfort feeds.   Acute renal failure: baseline around 2. Her creatinine is 2.56 with bUN of 74.    Chronic respiratory failure: vent dependent from [facility]. Pulmonary consulted for vent management and recommendations given.   Hypokalemia: replete as needed.     Code Status: Partial CODE.  Family Communication: discussed with son over the phone. Disposition Plan: pending.    Consultants:  Pulmonary  ID  Surgery.   pallIATIVE CARE   Procedures:  CT abdomen and pelvis  Left subclavian central line 9/1  CXR  Antibiotics:  cubicin  Fluconazole  Primaxin.   HPI/Subjective: Comfortable. Reports some pain in the knees , probably arthritic pain.   Objective: Filed Vitals:   01/23/14 0800  BP: 140/74  Pulse: 108  Temp: 97.3 F (36.3 C)  Resp: 37    Intake/Output Summary (Last 24 hours) at 01/23/14 1205 Last data filed at 01/23/14 0400  Gross per 24 hour  Intake   1210 ml  Output   1000 ml  Net    210 ml   Filed Weights    01/17/14 2200 01/20/14 0402  Weight: 85.7 kg (188 lb 15 oz) 85 kg (187 lb 6.3 oz)    Exam:   General:  Alert afebrile comfortable  Cardiovascular: s1s2  Respiratory: diminished air entry at bases.   Abdomen: soft, PEG in place with surrounding white based ulcers and erythema.   Musculoskeletal: no edema or cyanosis. Tender knees.   Data Reviewed: Basic Metabolic Panel:  Recent Labs Lab 01/17/14 1827  01/18/14 0407 01/19/14 0500 01/20/14 1052 01/21/14 0317 01/22/14 0612 01/23/14 0335  NA  --   < >  --  136* 136* 132* 131* 133*  K  --   < >  --  4.0 3.3* 4.5 2.9* 3.1*  CL  --   < >  --  99 98 96 97 98  CO2  --   < >  --  GLUCOSE  --   < >  --  215* 152* 172* 185* 171*  BUN  --   < >  --  74* 80* 82* 84* 86*  CREATININE  --   < >  --  2.56* 2.52* 2.41* 2.11* 1.91*  CALCIUM  --   < >  --  7.9* 7.9* 7.7* 7.9* 8.0*  MG 2.5  --  2.5 2.6* 2.5  --   --  1.7  PHOS  --   --  4.3 4.7* 5.4*  --  4.0 4.2  < > = values in this interval not displayed. Liver Function  Tests:  Recent Labs Lab 01/18/14 0406 01/19/14 0500 01/20/14 1052 01/23/14 0335  AST ALT 7 5 6 6   ALKPHOS 133* 117 111 107  BILITOT 0.2* 0.2* 0.2* 0.3  PROT 5.4* 5.5* 5.8* 5.9*  ALBUMIN 1.1* 1.1* 1.1* 1.1*   No results found for this basename: LIPASE, AMYLASE,  in the last 168 hours No results found for this basename: AMMONIA,  in the last 168 hours CBC:  Recent Labs Lab 01/17/14 1827 01/17/14 1836 01/18/14 0406 01/22/14 0612  WBC 24.4*  --  18.4* 23.0*  NEUTROABS 21.4*  --   --   --   HGB 8.6* 9.2* 9.5* 8.8*  HCT 26.2* 27.0* 28.4* 27.2*  MCV 83.4  --  83.3 86.6  PLT 346  --  291 317   Cardiac Enzymes:  Recent Labs Lab 01/17/14 1827 01/17/14 2221 01/18/14 0407 01/18/14 1345  CKTOTAL 32  --   --  47  TROPONINI  --  <0.30 <0.30 <0.30   BNP (last 3 results)  Recent Labs  01/18/14 0407  PROBNP 17040.0*   CBG:  Recent Labs Lab 01/22/14 1541  01/22/14 2025 01/23/14 0003 01/23/14 0355 01/23/14 0754  GLUCAP 176* 179* 190* 164* 161*    Recent Results (from the past 240 hour(s))  MRSA PCR SCREENING     Status: None   Collection Time    01/17/14 11:08 PM      Result Value Ref Range Status   MRSA by PCR NEGATIVE  NEGATIVE Final   Comment:            The GeneXpert MRSA Assay (FDA     approved for NASAL specimens     only), is one component of a     comprehensive MRSA colonization     surveillance program. It is not     intended to diagnose MRSA     infection nor to guide or     monitor treatment for     MRSA infections.  CLOSTRIDIUM DIFFICILE BY PCR     Status: None   Collection Time    01/18/14  3:04 PM      Result Value Ref Range Status   C difficile by pcr NEGATIVE  NEGATIVE Final     Studies: No results found.  Scheduled Meds: . chlorhexidine  15 mL Mouth/Throat BID  . DAPTOmycin (CUBICIN)  IV  500 mg Intravenous Q48H  . furosemide  40 mg Intravenous Daily  . heparin subcutaneous  5,000 Units Subcutaneous 3 times per day  . imipenem-cilastatin  250 mg Intravenous Q12H  . insulin aspart  0-15 Units Subcutaneous 6 times per day  . metoprolol  5 mg Intravenous Q6H  . pantoprazole (PROTONIX) IV  40 mg Intravenous Q24H  . silver sulfADIAZINE   Topical Daily   Continuous Infusions: . Marland KitchenTPN (CLINIMIX-E) Adult 80 mL/hr at 01/22/14 1718  . Marland KitchenTPN (CLINIMIX-E) Adult      Principal Problem:   PEG tube malfunction Active Problems:   Chronic respiratory failure   Tracheostomy in place   Ventilator dependent   Sepsis   CKD (chronic kidney disease)   Hypokalemia   Anemia   Acute on chronic respiratory failure   Palliative care encounter    Time spent: 35 minutes.     [facility]  [facility] Pager [phone] If 7PM-7AM, please contact night-coverage at [email or web address], password [facility] 01/23/2014, 12:05 PM  LOS: 6 days

## 2014-01-24 ENCOUNTER — Inpatient Hospital Stay (HOSPITAL_COMMUNITY): Payer: Medicare Other

## 2014-01-24 LAB — COMPREHENSIVE METABOLIC PANEL
ALK PHOS: 114 U/L (ref 39–117)
ALT: 14 U/L (ref 0–35)
AST: 53 U/L — ABNORMAL HIGH (ref 0–37)
Albumin: 1.1 g/dL — ABNORMAL LOW (ref 3.5–5.2)
Anion gap: 11 (ref 5–15)
BUN: 90 mg/dL — AB (ref 6–23)
CALCIUM: 8.4 mg/dL (ref 8.4–10.5)
CO2: 24 mEq/L (ref 19–32)
CREATININE: 1.75 mg/dL — AB (ref 0.50–1.10)
Chloride: 100 mEq/L (ref 96–112)
GFR calc non Af Amer: 25 mL/min — ABNORMAL LOW (ref >90)
GFR, EST AFRICAN AMERICAN: 29 mL/min — AB (ref >90)
GLUCOSE: 117 mg/dL — AB (ref 70–99)
POTASSIUM: 3.8 meq/L (ref 3.7–5.3)
Sodium: 135 mEq/L — ABNORMAL LOW (ref 137–147)
TOTAL PROTEIN: 6.2 g/dL (ref 6.0–8.3)
Total Bilirubin: 0.4 mg/dL (ref 0.3–1.2)

## 2014-01-24 LAB — CBC
HCT: 25.8 % — ABNORMAL LOW (ref 36.0–46.0)
Hemoglobin: 8.3 g/dL — ABNORMAL LOW (ref 12.0–15.0)
MCH: 27.8 pg (ref 26.0–34.0)
MCHC: 32.2 g/dL (ref 30.0–36.0)
MCV: 86.3 fL (ref 78.0–100.0)
Platelets: 341 10*3/uL (ref 150–400)
RBC: 2.99 MIL/uL — ABNORMAL LOW (ref 3.87–5.11)
RDW: 17.1 % — ABNORMAL HIGH (ref 11.5–15.5)
WBC: 15.5 10*3/uL — AB (ref 4.0–10.5)

## 2014-01-24 LAB — GLUCOSE, CAPILLARY
GLUCOSE-CAPILLARY: 105 mg/dL — AB (ref 70–99)
GLUCOSE-CAPILLARY: 123 mg/dL — AB (ref 70–99)
Glucose-Capillary: 111 mg/dL — ABNORMAL HIGH (ref 70–99)
Glucose-Capillary: 128 mg/dL — ABNORMAL HIGH (ref 70–99)
Glucose-Capillary: 128 mg/dL — ABNORMAL HIGH (ref 70–99)
Glucose-Capillary: 152 mg/dL — ABNORMAL HIGH (ref 70–99)

## 2014-01-24 LAB — CK: CK TOTAL: 39 U/L (ref 7–177)

## 2014-01-24 LAB — MAGNESIUM: Magnesium: 1.8 mg/dL (ref 1.5–2.5)

## 2014-01-24 LAB — PHOSPHORUS: Phosphorus: 4.6 mg/dL (ref 2.3–4.6)

## 2014-01-24 LAB — PREALBUMIN: Prealbumin: 7.6 mg/dL — ABNORMAL LOW (ref 17.0–34.0)

## 2014-01-24 LAB — TRIGLYCERIDES: TRIGLYCERIDES: 27 mg/dL (ref <150)

## 2014-01-24 MED ORDER — FUROSEMIDE 10 MG/ML IJ SOLN
20.0000 mg | Freq: Once | INTRAMUSCULAR | Status: AC
  Administered 2014-01-24: 20 mg via INTRAVENOUS
  Filled 2014-01-24: qty 2

## 2014-01-24 MED ORDER — TRACE MINERALS CR-CU-F-FE-I-MN-MO-SE-ZN IV SOLN
INTRAVENOUS | Status: AC
  Administered 2014-01-24: 17:00:00 via INTRAVENOUS
  Filled 2014-01-24: qty 2000

## 2014-01-24 MED ORDER — FAT EMULSION 20 % IV EMUL
250.0000 mL | INTRAVENOUS | Status: AC
  Administered 2014-01-24: 250 mL via INTRAVENOUS
  Filled 2014-01-24: qty 250

## 2014-01-24 NOTE — Progress Notes (Addendum)
CSW spoke to patient's nurse this morning; updated on bed availability at [facility] but she is unaware of any d/c plans for today.  Patient may undergo a change in trach to a [place] per nursing.  CSW is available to assist with d/c to [facility] when medically stable and bed availability.  Lorri Frederick. Jaci Lazier, [place] [phone] (holiday coverage)

## 2014-01-24 NOTE — Progress Notes (Signed)
This morning pt is exhibiting signs of dyspnea, gasping, with marked increased WOB.  Respiratory rate 34-51. Breath sounds are rhonchus and increasingly wet this morning and more diminished.  UOP of 725 for shift. Pt remains generally edematous.  Merdis Delay NP notified of above, advised to give the Lasix  iv 1000 dose early.  Was given at [phone].  Will continue to monitor.

## 2014-01-24 NOTE — Progress Notes (Addendum)
[facility] PROGRESS NOTE  Tricia Reed ZOX:[id number] DOB: [birth year] DOA: 01/17/2014 PCP: No primary provider on file. Interim summary: Tricia Reed is a 80 y.o. Female with prior h/o STEMI, s/p CABG, on trach vent dependent, c diff colitis, DM, CVA, dysphagia s/p PEG tube, anemia, was admitted from [facility] for PEG tube dysfunction. There is continuous leak from the PEG tube with surrounding area of cellulitis. She was started on IV antibiotics with IV Primaxin and IV cubicin, has completed 8 days of IV antibiotics and will stop them as per ID recommendations. Pulmonary consulted for vent management. Earlier today patient developed some respiratory distress and extra dose of lasix given . Her CXR remains the same.   Assessment/Plan:  Cellulitis from the PEG tube site with continuous leak: general surgery on board, patient is not a surgical candidate. No tube feeds. On IV antibiotics for the cellulitis around the peg tube.  Mild to moderate malnutrition on TNA: Palliative care consulted and recommendations given. SLP consulted and family wants to continue TNA . No comfort feeds.   Acute renal failure: baseline around 2. Her creatinine is 1.75. Much better today.   Chronic respiratory failure: vent dependent from [facility]. Pulmonary consulted for vent management and recommendations given.   Hypokalemia: replete as needed.   Leukocytosis: improving and she remained afebrile.   [employer] Malnutrition: on TPN.     Code Status: Partial CODE.  Family Communication: discussed with son over the phone. Disposition Plan: pending.    Consultants:  Pulmonary  ID  Surgery.   pallIATIVE CARE   Procedures:  CT abdomen and pelvis  Left subclavian central line 9/1  CXR  Antibiotics:  cubicin till 9/7  Fluconazole 9/6  Primaxin. 9/7  HPI/Subjective: Comfortable. Reports some pain in the knees , probably arthritic pain.  Was sob this am, improved  later during the day.   Objective: Filed Vitals:   01/24/14 1211  BP: 138/60  Pulse: 93  Temp:   Resp: 36    Intake/Output Summary (Last 24 hours) at 01/24/14 1411 Last data filed at 01/24/14 1300  Gross per 24 hour  Intake   2358 ml  Output   2125 ml  Net    233 ml   Filed Weights   01/17/14 2200 01/20/14 0402 01/24/14 0431  Weight: 85.7 kg (188 lb 15 oz) 85 kg (187 lb 6.3 oz) 90.7 kg (199 lb 15.3 oz)    Exam:   General:  Alert afebrile comfortable  Cardiovascular: s1s2  Respiratory: diminished air entry at bases.   Abdomen: soft, PEG in place with surrounding white based ulcers and erythema.   Musculoskeletal: no edema or cyanosis. Tender knees.   Data Reviewed: Basic Metabolic Panel:  Recent Labs Lab 01/18/14 0407 01/19/14 0500 01/20/14 1052 01/21/14 0317 01/22/14 0612 01/23/14 0335 01/24/14 0538  NA  --  136* 136* 132* 131* 133* 135*  K  --  4.0 3.3* 4.5 2.9* 3.1* 3.8  CL  --  99 98 96 97 98 100  CO2  --  GLUCOSE  --  215* 152* 172* 185* 171* 117*  BUN  --  74* 80* 82* 84* 86* 90*  CREATININE  --  2.56* 2.52* 2.41* 2.11* 1.91* 1.75*  CALCIUM  --  7.9* 7.9* 7.7* 7.9* 8.0* 8.4  MG 2.5 2.6* 2.5  --   --  1.7 1.8  PHOS 4.3 4.7* 5.4*  --  4.0 4.2 4.6   Liver Function  Tests:  Recent Labs Lab 01/18/14 0406 01/19/14 0500 01/20/14 1052 01/23/14 0335 01/24/14 0538  AST 53*  ALT 7 5 6 6 14   ALKPHOS 133* 117 111 107 114  BILITOT 0.2* 0.2* 0.2* 0.3 0.4  PROT 5.4* 5.5* 5.8* 5.9* 6.2  ALBUMIN 1.1* 1.1* 1.1* 1.1* 1.1*   No results found for this basename: LIPASE, AMYLASE,  in the last 168 hours No results found for this basename: AMMONIA,  in the last 168 hours CBC:  Recent Labs Lab 01/17/14 1827 01/17/14 1836 01/18/14 0406 01/22/14 0612 01/24/14 0538  WBC 24.4*  --  18.4* 23.0* 15.5*  NEUTROABS 21.4*  --   --   --   --   HGB 8.6* 9.2* 9.5* 8.8* 8.3*  HCT 26.2* 27.0* 28.4* 27.2* 25.8*  MCV 83.4  --  83.3  86.6 86.3  PLT 346  --  291 317 341   Cardiac Enzymes:  Recent Labs Lab 01/17/14 1827 01/17/14 2221 01/18/14 0407 01/18/14 1345 01/24/14 0538  CKTOTAL 32  --   --  47 39  TROPONINI  --  <0.30 <0.30 <0.30  --    BNP (last 3 results)  Recent Labs  01/18/14 0407  PROBNP 17040.0*   CBG:  Recent Labs Lab 01/23/14 2034 01/24/14 0001 01/24/14 0433 01/24/14 0729 01/24/14 1142  GLUCAP 158* 152* 128* 128* 111*    Recent Results (from the past 240 hour(s))  MRSA PCR SCREENING     Status: None   Collection Time    01/17/14 11:08 PM      Result Value Ref Range Status   MRSA by PCR NEGATIVE  NEGATIVE Final   Comment:            The GeneXpert MRSA Assay (FDA     approved for NASAL specimens     only), is one component of a     comprehensive MRSA colonization     surveillance program. It is not     intended to diagnose MRSA     infection nor to guide or     monitor treatment for     MRSA infections.  CLOSTRIDIUM DIFFICILE BY PCR     Status: None   Collection Time    01/18/14  3:04 PM      Result Value Ref Range Status   C difficile by pcr NEGATIVE  NEGATIVE Final     Studies: Dg Chest Port 1v Same Day  01/24/2014   CLINICAL DATA:  Increased work of breathing, tracheostomy.  EXAM: PORTABLE CHEST - 1 VIEW SAME DAY  COMPARISON:  01/18/2014.  FINDINGS: Tracheostomy is midline. Heart size stable. Thoracic aorta is calcified. Left IJ central line tip projects over the low SVC. There is patchy bilateral airspace disease, similar to the prior exam. Probable small bilateral pleural effusions.  IMPRESSION: Diffuse patchy bilateral airspace disease likely represents edema, stable. Small bilateral pleural effusions.   Electronically Signed   By: Leanna Battles M.D.   On: 01/24/2014 11:58    Scheduled Meds: . chlorhexidine  15 mL Mouth/Throat BID  . furosemide  20 mg Intravenous Once  . furosemide  40 mg Intravenous Daily  . heparin subcutaneous  5,000 Units Subcutaneous 3 times  per day  . insulin aspart  0-15 Units Subcutaneous 6 times per day  . metoprolol  5 mg Intravenous Q6H  . pantoprazole (PROTONIX) IV  40 mg Intravenous Q24H  . silver sulfADIAZINE   Topical Daily   Continuous Infusions: . Marland Kitchen  TPN (CLINIMIX-E) Adult 80 mL/hr at 01/23/14 1739  . TPN (CLINIMIX) Adult without lytes     And  . fat emulsion      Principal Problem:   PEG tube malfunction Active Problems:   Chronic respiratory failure   Tracheostomy in place   Ventilator dependent   Sepsis   CKD (chronic kidney disease)   Hypokalemia   Anemia   Acute on chronic respiratory failure   Palliative care encounter    Time spent: 25 minutes.     [facility]  [facility] Pager [phone] If 7PM-7AM, please contact night-coverage at [email or web address], password [facility] 01/24/2014, 2:11 PM  LOS: 7 days

## 2014-01-24 NOTE — Progress Notes (Signed)
Tricia Reed was very sleepy when I saw her this morning. She is difficult to engage in conversation and shrugs her shoulders and says she is "okay." Denies pain or any discomfort. I spoke with son, Jerilynn Som, via telephone. He was here Sunday and was pleased that she was awake, without pain, and interactive with them. He confirms plans to return to [facility], continue TPN, and ventilator. He also asks if there was any way for her to stay here at [facility] but I explained that she has to have acute hospital needs in order to be here - he has been pleased with her care here. No further questions or concerns at this time.  Yong Channel, NP Palliative Medicine Team Pager # [phone] (M-F 8a-5p) Team Phone # [phone] (Nights/Weekends)

## 2014-01-24 NOTE — Progress Notes (Signed)
CSW discussed patient with RNCM- [place]. Per [facility] representative- patient is actually appropriate for LTAC at [facility] at this time rather than return to the SNF.  CSW spoke with Burgess Amor, Admissions Coordinator- SNF- [facility] re: above.  CSW will need to assist with [facility] transport when d/c is indicated.  Lorri Frederick. Jaci Lazier, [place]  [phone]

## 2014-01-24 NOTE — Progress Notes (Signed)
PARENTERAL NUTRITION CONSULT NOTE - FOLLOW UP  Pharmacy Consult:  TPN Indication:  PEG tube dysfunction  No Known Allergies  Patient Measurements: Height:  (157.5 cm) Weight: 199 lb 15.3 oz (90.7 kg) IBW/kg (Calculated) : 50.1  Vital Signs: Temp: 97.7 F (36.5 C) (09/07 0726) Temp src: Oral (09/07 0726) BP: 124/65 mmHg (09/07 0726) Pulse Rate: 91 (09/07 0726) Intake/Output from previous day: 09/06 0701 - 09/07 0700 In: 1588 [IV Piggyback:200; TPN:1388] Out: 1825 [Urine:1825]  Labs:  Recent Labs  01/22/14 0612 01/24/14 0538  WBC 23.0* 15.5*  HGB 8.8* 8.3*  HCT 27.2* 25.8*  PLT 317 341     Recent Labs  01/22/14 0612 01/23/14 0335 01/24/14 0538  NA 131* 133* 135*  K 2.9* 3.1* 3.8  CL 97 98 100  CO2 GLUCOSE 185* 171* 117*  BUN 84* 86* 90*  CREATININE 2.11* 1.91* 1.75*  CALCIUM 7.9* 8.0* 8.4  MG  --  1.7 1.8  PHOS 4.0 4.2 4.6  PROT  --  5.9* 6.2  ALBUMIN  --  1.1* 1.1*  AST  --  25 53*  ALT  --  6 14  ALKPHOS  --  107 114  BILITOT  --  0.3 0.4  TRIG  --   --  27   Estimated Creatinine Clearance: 24.6 ml/min (by C-G formula based on Cr of 1.75).    Recent Labs  01/23/14 2034 01/24/14 0001 01/24/14 0433  GLUCAP 158* 152* 128*      Insulin Requirements in the past 24 hours:  13 units sensitive SSI  Assessment: 87 YOF admitted with PEG tube failure and started on TPN for nutritional support.  GI: PEG tube has surrounding tissue ulceration - MDs rec EOL discussion. Endo: DM, CBGs 128-181  , on SSI and 40 units insulin in TPN Lytes: low Na, K 3.8, phos 4.6, CoCa 10.72  Will need to remove lytes again due to high phos X Ca product Renal: gout. Neurogenic bladder with indwelling foley.  CKD - SCr 1.75   IV lasix 40 daily Pulm: trach dependent, FiO2 30% - pleural effusions on CT Cards: HTN / CHF - BP and HR wnl, pBNP 17040 - on IV Lopressor Hepatobil: AST elevated, TG  27- liver lesion on CT Neuro: hx CVA / depression ID: Cubicin  for cellulitis surrounding PEG tube, afebrile, WBC trending down 15.5 Best Practices: heparin SQ, CHG , PPI IV TPN Access: triple lumen placed 9/1 TPN day#: 6 (9/1 >> )  Current Nutrition:  TPN  Nutritional Goals:  1500-1600 kCal, 90-100 grams of protein per day   Plan:  - Change Clinimix 5/15 to No  electrolytes at goal rate of 80 ml/hr + IVFE at 10 ml/hr on MWF.  TPN + lipids MWF will provide 96 gm protein and 1569 KCal average daily. - Daily multivitamin and trace elements - Cont insulin to 40 units/2 L bag -Cont ssi to moderate coverage - Labs in AM  Talbert Cage, PharmD Pager:  [phone] 01/24/2014, 7:32 AM

## 2014-01-25 ENCOUNTER — Inpatient Hospital Stay (HOSPITAL_COMMUNITY): Payer: Medicare Other

## 2014-01-25 LAB — BASIC METABOLIC PANEL
Anion gap: 15 (ref 5–15)
BUN: 93 mg/dL — ABNORMAL HIGH (ref 6–23)
CO2: 21 meq/L (ref 19–32)
Calcium: 7.8 mg/dL — ABNORMAL LOW (ref 8.4–10.5)
Chloride: 95 mEq/L — ABNORMAL LOW (ref 96–112)
Creatinine, Ser: 1.66 mg/dL — ABNORMAL HIGH (ref 0.50–1.10)
GFR calc Af Amer: 31 mL/min — ABNORMAL LOW (ref >90)
GFR calc non Af Amer: 27 mL/min — ABNORMAL LOW (ref >90)
Glucose, Bld: 118 mg/dL — ABNORMAL HIGH (ref 70–99)
Potassium: 3.5 mEq/L — ABNORMAL LOW (ref 3.7–5.3)
SODIUM: 131 meq/L — AB (ref 137–147)

## 2014-01-25 LAB — GLUCOSE, CAPILLARY
Glucose-Capillary: 114 mg/dL — ABNORMAL HIGH (ref 70–99)
Glucose-Capillary: 114 mg/dL — ABNORMAL HIGH (ref 70–99)
Glucose-Capillary: 115 mg/dL — ABNORMAL HIGH (ref 70–99)
Glucose-Capillary: 120 mg/dL — ABNORMAL HIGH (ref 70–99)
Glucose-Capillary: 144 mg/dL — ABNORMAL HIGH (ref 70–99)

## 2014-01-25 MED ORDER — FUROSEMIDE 10 MG/ML IJ SOLN: 40.0000 mg | Freq: Two times a day (BID) | INTRAMUSCULAR | Status: AC

## 2014-01-25 MED ORDER — PANTOPRAZOLE SODIUM 40 MG IV SOLR: 40.0000 mg | INTRAVENOUS | Status: AC

## 2014-01-25 MED ORDER — METOPROLOL TARTRATE 1 MG/ML IV SOLN: 5.0000 mg | Freq: Four times a day (QID) | INTRAVENOUS | Status: AC

## 2014-01-25 MED ORDER — TRACE MINERALS CR-CU-F-FE-I-MN-MO-SE-ZN IV SOLN
INTRAVENOUS | Status: DC
  Filled 2014-01-25: qty 2000

## 2014-01-25 MED ORDER — FAT EMULSION 20 % IV EMUL: 250.0000 mL | INTRAVENOUS | Status: AC

## 2014-01-25 MED ORDER — POTASSIUM CHLORIDE 10 MEQ/100ML IV SOLN
10.0000 meq | INTRAVENOUS | Status: AC
  Administered 2014-01-25 (×2): 10 meq via INTRAVENOUS
  Filled 2014-01-25 (×2): qty 100

## 2014-01-25 NOTE — Progress Notes (Signed)
[facility] here to transport patient to [facility]. Report was given to [facility], RN at [facility] prior to shift change. Pt A/O, VSS, no s/s of distress. Son is aware of patient transfer.   M.Foster Simpson, RN

## 2014-01-25 NOTE — Progress Notes (Signed)
Tera Mater, son, is notified of this transfer.

## 2014-01-25 NOTE — Progress Notes (Signed)
Patient ID: Tricia Reed, female   DOB: 06-27-1926, 80 y.o.   MRN: [id number]    Subjective: Pt on trach.  Awake.  Objective: Vital signs in last 24 hours: Temp:  [97.2 F (36.2 C)-98.7 F (37.1 C)] 98.4 F (36.9 C) (09/08 0727) Pulse Rate:  [80-105] 104 (09/08 0817) Resp:  [25-37] 33 (09/08 0817) BP: (123-164)/(49-92) 148/49 mmHg (09/08 0817) SpO2:  [94 %-100 %] 99 % (09/08 0817) FiO2 (%):  [30 %] 30 % (09/08 0817) Last BM Date: 01/22/14  Intake/Output from previous day: 09/07 0701 - 09/08 0700 In: 2180 [IV Piggyback:210; TPN:1970] Out: 2175 [Urine:2175] Intake/Output this shift: Total I/O In: -  Out: 350 [Urine:350]  PE: Abd: soft, PEG tube in place with large opening around PEG site with excoriations on the skin around this.  This area is packed to help keep the tube in place without sliding too much.  No significant erythema or evidence of acute infection  Lab Results:   Recent Labs  01/24/14 0538  WBC 15.5*  HGB 8.3*  HCT 25.8*  PLT 341   BMET  Recent Labs  01/24/14 0538 01/25/14 0453  NA 135* 131*  K 3.8 3.5*  CL 100 95*  CO2 24 21  GLUCOSE 117* 118*  BUN 90* 93*  CREATININE 1.75* 1.66*  CALCIUM 8.4 7.8*   PT/INR No results found for this basename: LABPROT, INR,  in the last 72 hours CMP     Component Value Date/Time   NA 131* 01/25/2014 0453   K 3.5* 01/25/2014 0453   CL 95* 01/25/2014 0453   CO2 21 01/25/2014 0453   GLUCOSE 118* 01/25/2014 0453   BUN 93* 01/25/2014 0453   CREATININE 1.66* 01/25/2014 0453   CALCIUM 7.8* 01/25/2014 0453   PROT 6.2 01/24/2014 0538   ALBUMIN 1.1* 01/24/2014 0538   AST 53* 01/24/2014 0538   ALT 14 01/24/2014 0538   ALKPHOS 114 01/24/2014 0538   BILITOT 0.4 01/24/2014 0538   GFRNONAA 27* 01/25/2014 0453   GFRAA 31* 01/25/2014 0453   Lipase  No results found for this basename: lipase       Studies/Results: Dg Chest Port 1v Same Day  01/24/2014   CLINICAL DATA:  Increased work of breathing, tracheostomy.  EXAM: PORTABLE CHEST - 1  VIEW SAME DAY  COMPARISON:  01/18/2014.  FINDINGS: Tracheostomy is midline. Heart size stable. Thoracic aorta is calcified. Left IJ central line tip projects over the low SVC. There is patchy bilateral airspace disease, similar to the prior exam. Probable small bilateral pleural effusions.  IMPRESSION: Diffuse patchy bilateral airspace disease likely represents edema, stable. Small bilateral pleural effusions.   Electronically Signed   By: Leanna Battles M.D.   On: 01/24/2014 11:58    Anti-infectives: Anti-infectives   Start     Dose/Rate Route Frequency Ordered Stop   01/20/14 1000  DAPTOmycin (CUBICIN) 500 mg in sodium chloride 0.9 % IVPB  Status:  Discontinued     500 mg 220 mL/hr over 30 Minutes Intravenous Every 48 hours 01/18/14 1450 01/24/14 1410   01/18/14 2200  ceFEPIme (MAXIPIME) 1 g in dextrose 5 % 50 mL IVPB  Status:  Discontinued     1 g 100 mL/hr over 30 Minutes Intravenous Every 24 hours 01/17/14 2103 01/17/14 2108   01/18/14 1000  DAPTOmycin (CUBICIN) 500 mg in sodium chloride 0.9 % IVPB  Status:  Discontinued     500 mg 220 mL/hr over 30 Minutes Intravenous Every 24 hours 01/18/14 0904 01/18/14 1450  01/18/14 0000  fluconazole (DIFLUCAN) IVPB 200 mg  Status:  Discontinued     200 mg 100 mL/hr over 60 Minutes Intravenous Every 24 hours 01/17/14 2128 01/18/14 1146   01/17/14 2200  imipenem-cilastatin (PRIMAXIN) 500 mg in sodium chloride 0.9 % 100 mL IVPB  Status:  Discontinued     500 mg 200 mL/hr over 30 Minutes Intravenous 3 times per day 01/17/14 2107 01/17/14 2111   01/17/14 2130  imipenem-cilastatin (PRIMAXIN) 250 mg in sodium chloride 0.9 % 100 mL IVPB  Status:  Discontinued     250 mg 200 mL/hr over 30 Minutes Intravenous Every 12 hours 01/17/14 2112 01/24/14 1410   01/17/14 2115  ceFEPIme (MAXIPIME) 2 g in dextrose 5 % 50 mL IVPB  Status:  Discontinued     2 g 100 mL/hr over 30 Minutes Intravenous  Once 01/17/14 2103 01/17/14 2108       Assessment/Plan  1.   PEG site excoriation 2. Multiple medical problems  Plan: 1. Cont current care.  IF PEG continues to leak when tube feeds given, would continue to hold TFs and cont TNA.  Agree with further palliative care meetings and discussions with this patient.  LOS: 8 days    Neilson Oehlert E 01/25/2014, 8:20 AM Pager: [phone]

## 2014-01-25 NOTE — Discharge Summary (Signed)
Physician Discharge Summary  Tricia Reed WGN:[id number] DOB: January 30, 1927 DOA: 01/17/2014  PCP: No primary provider on file.  Admit date: 01/17/2014 Discharge date: 01/25/2014  Time spent: 30 minutes  Recommendations for Outpatient Follow-up:  1. Follow u pwith PCP as recommended.   Discharge Diagnoses:  Principal Problem:   PEG tube malfunction Active Problems:   Chronic respiratory failure   Tracheostomy in place   Ventilator dependent   Sepsis   CKD (chronic kidney disease)   Hypokalemia   Anemia   Acute on chronic respiratory failure   Palliative care encounter   Discharge Condition: slightly improved.   Diet recommendation: TPN  Filed Weights   01/17/14 2200 01/20/14 0402 01/24/14 0431  Weight: 85.7 kg (188 lb 15 oz) 85 kg (187 lb 6.3 oz) 90.7 kg (199 lb 15.3 oz)    History of present illness:  Tricia Reed is a 80 y.o. Female with prior h/o STEMI, s/p CABG, on trach vent dependent, c diff colitis, DM, CVA, dysphagia s/p PEG tube, anemia, was admitted from [facility] for PEG tube dysfunction. There is continuous leak from the PEG tube with surrounding area of cellulitis. She was started on IV antibiotics with IV Primaxin and IV cubicin, has completed 8 days of IV antibiotics and will stop them as per ID recommendations. Pulmonary consulted for vent management.   Hospital Course:  Cellulitis from the PEG tube site with continuous leak: general surgery on board, patient is not a surgical candidate. No tube feeds. On IV antibiotics for the cellulitis around the peg tube. He has completed teh course of antibiotics.  Mild to moderate malnutrition on TNA: Palliative care consulted and recommendations given. SLP consulted and family wants to continue TNA . No comfort feeds.  Acute renal failure: baseline around 2. Her creatinine is 1.75. Much better today.  Chronic respiratory failure: vent dependent from [facility]. Pulmonary consulted for vent management and  recommendations given.  Hypokalemia: replete as needed.  Leukocytosis: improving and she remained afebrile.  Protein Energy Malnutrition: on TPN.      Procedures:  none  Consultations:  ID  PCCM  Discharge Exam: Filed Vitals:   01/25/14 1201  BP: 180/79  Pulse: 102  Temp: 98.7 F (37.1 C)  Resp: 38    General: alert afebrile comfortable Cardiovascular: s1s2 Respiratory: ctab  Discharge Instructions You were cared for by a hospitalist during your hospital stay. If you have any questions about your discharge medications or the care you received while you were in the hospital after you are discharged, you can call the unit and asked to speak with the hospitalist on call if the hospitalist that took care of you is not available. Once you are discharged, your primary care physician will handle any further medical issues. Please note that NO REFILLS for any discharge medications will be authorized once you are discharged, as it is imperative that you return to your primary care physician (or establish a relationship with a primary care physician if you do not have one) for your aftercare needs so that they can reassess your need for medications and monitor your lab values.   Current Discharge Medication List    START taking these medications   Details  fat emulsion 20 % infusion Inject 250 mLs into the vein continuous. Qty: 250 mL    furosemide (LASIX) 10 MG/ML injection Inject 4 mLs (40 mg total) into the vein 2 (two) times daily. Qty: 4 mL, Refills: 0    metoprolol (LOPRESSOR) 1 MG/ML  injection Inject 5 mLs (5 mg total) into the vein every 6 (six) hours. Qty: 5 mL    pantoprazole (PROTONIX) 40 MG injection Inject 40 mg into the vein daily. Qty: 1 each      CONTINUE these medications which have NOT CHANGED   Details  chlorhexidine (PERIDEX) 0.12 % solution Use as directed 15 mLs in the mouth or throat 2 (two) times daily.    enoxaparin (LOVENOX) 30 MG/0.3ML  injection Inject 30 mg into the skin daily. Discontinued 01/12/14    glucagon (GLUCAGEN HYPOKIT) 1 MG SOLR injection Inject 1 mg into the vein once as needed for low blood sugar.    insulin detemir (LEVEMIR) 100 UNIT/ML injection Inject 12 Units into the skin 2 (two) times daily.    insulin regular (NOVOLIN R,HUMULIN R) 100 units/mL injection Inject 0-5 Units into the skin 3 (three) times daily before meals.    CALCIUM GLUCONATE IV Inject 1 application into the vein as needed (for calcium 6.5).      STOP taking these medications     allopurinol (ZYLOPRIM) 100 MG tablet      aspirin 81 MG chewable tablet      DAPTOMYCIN IV      divalproex (DEPAKOTE SPRINKLE) 125 MG capsule      ferrous sulfate 300 (60 FE) MG/5ML syrup      fluconazole (DIFLUCAN) 200-0.9 MG/100ML-% IVPB      furosemide (LASIX) 80 MG tablet      hydrALAZINE (APRESOLINE) 25 MG tablet      meropenem (MERREM) 500 MG injection      metoprolol (LOPRESSOR) 50 MG tablet      potassium chloride 40 MEQ/15ML (20%) LIQD      sodium bicarbonate 650 MG tablet      vancomycin 500 mg in sodium chloride 0.9 % 100 mL      linezolid (ZYVOX) 2 MG/ML IVPB        No Known Allergies    The results of significant diagnostics from this hospitalization (including imaging, microbiology, ancillary and laboratory) are listed below for reference.    Significant Diagnostic Studies: Ct Abdomen Wo Contrast  01/22/14   CLINICAL DATA:  Evaluate PEG tube position abdominal wall.  EXAM: CT ABDOMEN WITHOUT CONTRAST  TECHNIQUE: Multidetector CT imaging of the abdomen was performed following the standard protocol without IV contrast.  COMPARISON:  None.  FINDINGS: There are moderate bilateral pleural effusions, incompletely visualized. Patchy opacities are partially visualized in the bilateral lower lobes, right middle lobe, and lingula, with evaluation partially limited by respiratory motion artifact. Coronary artery calcification is  partially visualized. Mildly decreased attenuation of the blood pool is suggestive anemia.  There is a 1.2 cm hypodensity in the left hepatic lobe with associated calcification. Multiple calcified gallstones are present. No biliary dilatation is identified. Calcification at the medial aspect of the spleen is favored to be vascular. The adrenal glands and pancreas are unremarkable. Right lower pole renal cyst measures 8.4 cm. Right renal parapelvic cyst measures 2.5 cm. 3 mm nonobstructing calculus is present in the upper pole of the left kidney. 2 mm nonobstructing stone versus vascular calcification is present in the left lower pole. Left renal interpolar cyst measures 4.3 cm.  A percutaneous gastrostomy tube is present. The balloon is inflated, with the tube in the distal gastric body. The stomach is apposed to the anterior abdominal wall at the gastrostomy site. There is evidence of skin/subcutaneous soft tissue ulceration at the gastrostomy site. No fluid collection is seen  in this area. The visualized small and large bowel are nondilated.  No intraperitoneal free fluid or free air is seen in the abdomen. No enlarged lymph nodes are identified. Moderate atherosclerotic calcification is noted of the abdominal aorta and its major branch vessels. There is moderate, diffuse body wall edema. Sclerosis is noted in the right ilium adjacent to the sacroiliac joint. Multilevel thoracolumbar disc degeneration and lower lumbar facet arthrosis are noted.  IMPRESSION: 1. Gastrostomy tube in place in the distal stomach. Superficial soft tissue ulceration at the gastrostomy site. No fluid collection. 2. Cholelithiasis. 3. Anasarca and moderate bilateral pleural effusions. Partially visualized lung opacities, which could reflect edema or multifocal infection. 4. Small, partially calcified low-density liver lesion. This is nonspecific and favored to be benign, such as a cyst related to prior infection or hemangioma, although a  metastatic or primary malignant lesion cannot be completely excluded. Further evaluation with contrast-enhanced abdominal MRI could be performed on an elective basis as clinically warranted based on patient's other comorbidities.   Electronically Signed   By: Sebastian Ache   On: 01/18/2014 14:57   Dg Chest Port 1 View  01/25/2014   CLINICAL DATA:  Upper airway wheezing, tracheostomy, increased respirations, assess atelectasis, history diabetes, hypertension, stroke, CHF  EXAM: PORTABLE CHEST - 1 VIEW  COMPARISON:  Portable exam 0945 hr compared to 01/24/2014  FINDINGS: LEFT jugular line stable tip projecting over SVC.  Tracheostomy tube projects over tracheal air column.  Normal heart size post median sternotomy.  Atherosclerotic calcification aorta.  Severe diffuse BILATERAL pulmonary infiltrates, slightly increased, question edema or infection. Questionable small bibasilar effusions.  No pneumothorax or acute osseous findings.  IMPRESSION: Increased BILATERAL pulmonary infiltrates, question edema versus infection.   Electronically Signed   By: Ulyses Southward M.D.   On: 01/25/2014 10:07   Dg Chest Port 1 View  01/18/2014   CLINICAL DATA:  line placement  EXAM: PORTABLE CHEST - 1 VIEW  COMPARISON:  01/17/2014  FINDINGS: Right ij central line has been removed and a new left ij central line placed to the distal svc. no pneumothorax. tracheostomy stable. previous median sternotomy. moderate diffuse interstitial and airspace opacities throughout both lungs, involving bases more than apices, perhaps marginally improved since previous exam. Suspect small pleural effusions. Heart size upper limits normal.  Degenerative changes in bilateral shoulders. Spurring in the thoracic spine.  IMPRESSION: 1. Central line to the distal SVC without pneumothorax. 2. Moderate bilateral interstitial and airspace opacities, perhaps marginally improved since previous film.   Electronically Signed   By: Oley Balm M.D.   On: 01/18/2014  00:57   Dg Chest Portable 1 View  01/17/2014   CLINICAL DATA:  Central line placement  EXAM: PORTABLE CHEST - 1 VIEW  COMPARISON:  None.  FINDINGS: Right internal jugular central line tip identified projecting over superior vena cava at the level of the azygos arch, approximately 1.5 cm above the cavoatrial junction. No pneumothorax. Tracheostomy tube projects over the tracheal air column.  Heart size normal. There is extensive bilateral central hazy opacity. There is calcification of the aortic arch.  IMPRESSION: Central line as described. Extensive bilateral airspace disease possibly representing pulmonary edema.   Electronically Signed   By: Esperanza Heir M.D.   On: 01/17/2014 19:12   Dg Chest Port 1v Same Day  01/24/2014   CLINICAL DATA:  Increased work of breathing, tracheostomy.  EXAM: PORTABLE CHEST - 1 VIEW SAME DAY  COMPARISON:  01/18/2014.  FINDINGS: Tracheostomy is midline. Heart  size stable. Thoracic aorta is calcified. Left IJ central line tip projects over the low SVC. There is patchy bilateral airspace disease, similar to the prior exam. Probable small bilateral pleural effusions.  IMPRESSION: Diffuse patchy bilateral airspace disease likely represents edema, stable. Small bilateral pleural effusions.   Electronically Signed   By: Leanna Battles M.D.   On: 01/24/2014 11:58    Microbiology: Recent Results (from the past 240 hour(s))  MRSA PCR SCREENING     Status: None   Collection Time    01/17/14 11:08 PM      Result Value Ref Range Status   MRSA by PCR NEGATIVE  NEGATIVE Final   Comment:            The GeneXpert MRSA Assay (FDA     approved for NASAL specimens     only), is one component of a     comprehensive MRSA colonization     surveillance program. It is not     intended to diagnose MRSA     infection nor to guide or     monitor treatment for     MRSA infections.  CLOSTRIDIUM DIFFICILE BY PCR     Status: None   Collection Time    01/18/14  3:04 PM      Result  Value Ref Range Status   C difficile by pcr NEGATIVE  NEGATIVE Final     Labs: Basic Metabolic Panel:  Recent Labs Lab 01/19/14 0500 01/20/14 1052 01/21/14 0317 01/22/14 0612 01/23/14 0335 01/24/14 0538 01/25/14 0453  NA 136* 136* 132* 131* 133* 135* 131*  K 4.0 3.3* 4.5 2.9* 3.1* 3.8 3.5*  CL 99 98 96 97 98 100 95*  CO2 GLUCOSE 215* 152* 172* 185* 171* 117* 118*  BUN 74* 80* 82* 84* 86* 90* 93*  CREATININE 2.56* 2.52* 2.41* 2.11* 1.91* 1.75* 1.66*  CALCIUM 7.9* 7.9* 7.7* 7.9* 8.0* 8.4 7.8*  MG 2.6* 2.5  --   --  1.7 1.8  --   PHOS 4.7* 5.4*  --  4.0 4.2 4.6  --    Liver Function Tests:  Recent Labs Lab 01/19/14 0500 01/20/14 1052 01/23/14 0335 01/24/14 0538  AST 53*  ALT 5 6 6 14   ALKPHOS 117 111 107 114  BILITOT 0.2* 0.2* 0.3 0.4  PROT 5.5* 5.8* 5.9* 6.2  ALBUMIN 1.1* 1.1* 1.1* 1.1*   No results found for this basename: LIPASE, AMYLASE,  in the last 168 hours No results found for this basename: AMMONIA,  in the last 168 hours CBC:  Recent Labs Lab 01/22/14 0612 01/24/14 0538  WBC 23.0* 15.5*  HGB 8.8* 8.3*  HCT 27.2* 25.8*  MCV 86.6 86.3  PLT 317 341   Cardiac Enzymes:  Recent Labs Lab 01/18/14 1345 01/24/14 0538  CKTOTAL 47 39  TROPONINI <0.30  --    BNP: BNP (last 3 results)  Recent Labs  01/18/14 0407  PROBNP 17040.0*   CBG:  Recent Labs Lab 01/24/14 2015 01/24/14 2355 01/25/14 0443 01/25/14 0726 01/25/14 1234  GLUCAP 123* 114* 115* 114* 144*       Signed:  Breion Novacek  [facility] 01/25/2014, 1:16 PM

## 2014-01-25 NOTE — Progress Notes (Signed)
PULMONARY / CRITICAL CARE MEDICINE   Name: Tricia Reed MRN: [id number] DOB: [birth year]    ADMISSION DATE:  01/17/2014 CONSULTATION DATE:  01/17/2014   REFERRING MD :  [facility] hospitalist  CHIEF COMPLAINT:  Chronic respiratory failure  Brief summary:  80 year old AA female. Admitted from [facility] after PEG malposition. PCCM consulted for vent mgmt - chronic respiratory failure.   SUBJ: Cuff pressures better, no changes in clinical status  VITAL SIGNS: Temp:  [97.2 F (36.2 C)-98.7 F (37.1 C)] 98.4 F (36.9 C) (09/08 0727) Pulse Rate:  [80-105] 104 (09/08 0817) Resp:  [25-37] 33 (09/08 0817) BP: (123-164)/(49-92) 148/49 mmHg (09/08 0817) SpO2:  [94 %-100 %] 99 % (09/08 0817) FiO2 (%):  [30 %] 30 % (09/08 0817) HEMODYNAMICS:   VENTILATOR SETTINGS: Vent Mode:  [-] PRVC FiO2 (%):  [30 %] 30 % Set Rate:  [16 bmp] 16 bmp Vt Set:  [450 mL] 450 mL PEEP:  [5 cmH20] 5 cmH20 Plateau Pressure:  [24 cmH20-31 cmH20] 24 cmH20 INTAKE / OUTPUT:  Intake/Output Summary (Last 24 hours) at 01/25/14 0918 Last data filed at 01/25/14 0733  Gross per 24 hour  Intake   2020 ml  Output   2225 ml  Net   -205 ml    PHYSICAL EXAMINATION: General:  NAD.  Neuro:  RASS 0. Diffusely weak HEENT:  WNL. + airleak w/ cuff pressure at 38 improved Cardiovascular: s1 s2 rrr Lungs:  Clear anteriorly Abdomen:  Obese, soft, NT Ext: + symmetric edema  CBC Recent Labs     01/24/14  0538  WBC  15.5*  HGB  8.3*  HCT  25.8*  PLT  341    Coag's No results found for this basename: APTT, INR,  in the last 72 hours  BMET Recent Labs     01/23/14  0335  01/24/14  0538  01/25/14  0453  NA  133*  135*  131*  K  3.1*  3.8  3.5*  CL  98  100  95*  CO2  BUN  86*  90*  93*  CREATININE  1.91*  1.75*  1.66*  GLUCOSE  171*  117*  118*    Electrolytes Recent Labs     01/23/14  0335  01/24/14  0538  01/25/14  0453  CALCIUM  8.0*  8.4  7.8*  MG  1.7  1.8   --   PHOS  4.2  4.6   --      Sepsis Markers No results found for this basename: LACTICACIDVEN, PROCALCITON, O2SATVEN,  in the last 72 hours  ABG No results found for this basename: PHART, PCO2ART, PO2ART,  in the last 72 hours  Liver Enzymes Recent Labs     01/23/14  0335  01/24/14  0538  AST  25  53*  ALT  6  14  ALKPHOS  107  114  BILITOT  0.3  0.4  ALBUMIN  1.1*  1.1*    Cardiac Enzymes No results found for this basename: TROPONINI, PROBNP,  in the last 72 hours  Glucose Recent Labs     01/24/14  1142  01/24/14  1549  01/24/14  2015  01/24/14  2355  01/25/14  0443  01/25/14  0726  GLUCAP  111*  105*  123*  114*  115*  114*    Imaging Dg Chest Port 1v Same Day  01/24/2014   CLINICAL DATA:  Increased work of breathing, tracheostomy.  EXAM:  PORTABLE CHEST - 1 VIEW SAME DAY  COMPARISON:  01/18/2014.  FINDINGS: Tracheostomy is midline. Heart size stable. Thoracic aorta is calcified. Left IJ central line tip projects over the low SVC. There is patchy bilateral airspace disease, similar to the prior exam. Probable small bilateral pleural effusions.  IMPRESSION: Diffuse patchy bilateral airspace disease likely represents edema, stable. Small bilateral pleural effusions.   Electronically Signed   By: Leanna Battles M.D.   On: 01/24/2014 11:58     ASSESSMENT / PLAN:  Chronic respiratory failure/vent dependence/trach status  Pulm edema pattern on CXR Doubt PNA PEG cellulitis - ID managing abx G tube malfunction - CCS evaluating and managing  Discussion See Dr Sung Amabile note on 9/2. She remains poor candidate for aggressive interventions. agree  Plan:   Cont full vent support  Cont vent bundle rr high, limits wean attempts at baseline Obtain pcxr Cuff pressures r improved, will reduce air further and assess tv response likely to dc to [facility] today where they utilize portex If cuff pressors worsen will consider change to extra long 6 with possible dilation needed - with such a poor prognosis  would hate to pit her through a change and pain Peak pressures are slightly elevated, consider TV reduction to 400: plat remain low normal    Mcarthur Rossetti. Tyson Alias, MD, FACP Pgr: [phone] [facility]

## 2014-01-25 NOTE — Progress Notes (Signed)
Tricia Reed remains sleepy appearing and does not very much engage in conversation. Flat affect with quick yes/no/okay answers in which she clearly does not wish to converse. Family is not at bedside and plan to come tomorrow per our conversation yesterday. No changes in goals of care. Asked RN to call son if she transfers to [facility] and make him aware. She denies pain, worry, anxiety, and any other concerns.   Yong Channel, NP Palliative Medicine Team Pager # [phone] (M-F 8a-5p) Team Phone # [phone] (Nights/Weekends)

## 2014-01-25 NOTE — Progress Notes (Addendum)
PARENTERAL NUTRITION CONSULT NOTE - FOLLOW UP  Pharmacy Consult:  TPN Indication:  PEG tube dysfunction  No Known Allergies  Patient Measurements: Height:  (157.5 cm) Weight: 199 lb 15.3 oz (90.7 kg) IBW/kg (Calculated) : 50.1  Vital Signs: Temp: 98.4 F (36.9 C) (09/08 0727) Temp src: Oral (09/08 0727) BP: 164/79 mmHg (09/08 0727) Pulse Rate: 105 (09/08 0727) Intake/Output from previous day: 09/07 0701 - 09/08 0700 In: 2180 [IV Piggyback:210; TPN:1970] Out: 2175 [Urine:2175]  Labs:  Recent Labs  01/24/14 0538  WBC 15.5*  HGB 8.3*  HCT 25.8*  PLT 341     Recent Labs  01/23/14 0335 01/24/14 0538 01/25/14 0453  NA 133* 135* 131*  K 3.1* 3.8 3.5*  CL 98 100 95*  CO2 GLUCOSE 171* 117* 118*  BUN 86* 90* 93*  CREATININE 1.91* 1.75* 1.66*  CALCIUM 8.0* 8.4 7.8*  MG 1.7 1.8  --   PHOS 4.2 4.6  --   PROT 5.9* 6.2  --   ALBUMIN 1.1* 1.1*  --   AST 25 53*  --   ALT 6 14  --   ALKPHOS 107 114  --   BILITOT 0.3 0.4  --   PREALBUMIN  --  7.6*  --   TRIG  --  27  --    Estimated Creatinine Clearance: 25.9 ml/min (by C-G formula based on Cr of 1.66).    Recent Labs  01/24/14 2015 01/24/14 2355 01/25/14 0443  GLUCAP 123* 114* 115*      Insulin Requirements in the past 24 hours:  4 units sensitive SSI + 40 Units regular insulin in TPN   Assessment: 87 YOF admitted with PEG tube failure and started on TPN for nutritional support.  GI: PEG tube has surrounding tissue ulceration - MDs rec EOL discussion. Family wants to continue TNA. No comfort feeds. Pre albumin up to 7.6 (9/8)  Endo: DM, CBGs <180  , On SSI  Lytes: low Na, K 3.5, phos 4.6, CoCa ~ 10.12   Renal: gout. Neurogenic bladder with indwelling foley.  CKD - SCr 1.66   IV lasix 40 daily. UOP 1 mL/kg/hr  Pulm: trach dependent, FiO2 30% - pleural effusions on CT Cards: HTN / CHF - BP and HR elevated, pBNP 17040 - on IV Lopressor Hepatobil: AST elevated, TG  27- liver lesion on  CT Neuro: hx CVA / depression ID: Cubicin for cellulitis surrounding PEG tube, afebrile, WBC trending down 15.5 Best Practices: heparin SQ, CHG , PPI IV TPN Access: triple lumen placed 9/1 TPN day#: 7 (9/1 >> )  Current Nutrition:  TPN  Nutritional Goals:  1500-1600 kCal, 90-100 grams of protein per day   Plan:  - Change back to Clinimix 5/15 WITH electrolytes at goal rate of 80 ml/hr + IVFE at 10 ml/hr on MWF.  TPN + lipids MWF will provide 96 gm protein and 1569 KCal average daily. - KCl 10 mEq IV x 2 runs  - Daily multivitamin and trace elements - Cont insulin to 40 units/2 L bag -Cont ssi to moderate coverage - Labs in AM  Vinnie Level, PharmD.  Clinical Pharmacist Pager [phone]

## 2014-01-25 NOTE — Progress Notes (Signed)
Report given to mel Ponsones, RN of [facility] at 5:19 PM.  [facility] was called and stated that they will be here to pick patient after 7 pm.

## 2014-01-25 NOTE — Progress Notes (Signed)
NUTRITION FOLLOW UP  INTERVENTION:  TPN per pharmacy RD to follow for nutrition care plan  NUTRITION DIAGNOSIS: Altered GI function as evidenced by malfunctioning PEG tube as evidenced by TPN initiation, ongoing  Goal: Pt to meet >/= 90% of their estimated nutrition needs, met  Monitor:  TPN prescription, TF initiation, weight, labs, I/O's  ASSESSMENT: 80 y.o. Female with PMH DM, trach, dysphagia, C diff, gout, HTN and CVA; admitted from [facility] after PEG tube became non-functional.  Patient is currently on ventilator support -- trach MV: 11.5 L/min Temp (24hrs), Avg:98.3 F (36.8 C), Min:97.2 F (36.2 C), Max:98.7 F (37.1 C)   Patient continues to receive TPN.  Current prescription is Clinimix E 5/15 @ 80 ml/hr and lipids @ 10 ml/hr on MWF.  Provides 1569 kcal and 96 grams protein per day.  Meets 92% minimum re-estimated energy needs and 100% minimum estimated protein needs.  Palliative Care Team following.  Family would like to continue TPN.  No comfort feeds.  Surgery notes reviewed.  Plan is to continue to hold tube feeding.  Height: Ht Readings from Last 1 Encounters:  01/17/14 $RemoveB'5\' 2"'[id number]$  (1.575 m)    Weight: Wt Readings from Last 1 Encounters:  01/24/14 199 lb 15.3 oz (90.7 kg)    BMI:  Body mass index is 36.56 kg/(m^2).  Re-estimated Needs: Kcal: 1700 Protein: 90-100 gm Fluid: per MD  Skin: Stage II pressure ulcer to left buttock  Diet Order: NPO   Intake/Output Summary (Last 24 hours) at 01/25/14 1144 Last data filed at 01/25/14 0900  Gross per 24 hour  Intake   1920 ml  Output   2225 ml  Net   -305 ml    Labs:   Recent Labs Lab 01/20/14 1052  01/22/14 0612 01/23/14 0335 01/24/14 0538 01/25/14 0453  NA 136*  < > 131* 133* 135* 131*  K 3.3*  < > 2.9* 3.1* 3.8 3.5*  CL 98  < > 97 98 100 95*  CO2 24  < > $R'19 22 24 21  'fj$ BUN 80*  < > 84* 86* 90* 93*  CREATININE 2.52*  < > 2.11* 1.91* 1.75* 1.66*  CALCIUM 7.9*  < > 7.9* 8.0* 8.4 7.8*  MG 2.5   --   --  1.7 1.8  --   PHOS 5.4*  --  4.0 4.2 4.6  --   GLUCOSE 152*  < > 185* 171* 117* 118*  < > = values in this interval not displayed.  CBG (last 3)   Recent Labs  01/24/14 2355 01/25/14 0443 01/25/14 0726  GLUCAP 114* 115* 114*    Scheduled Meds: . chlorhexidine  15 mL Mouth/Throat BID  . furosemide  40 mg Intravenous Daily  . heparin subcutaneous  5,000 Units Subcutaneous 3 times per day  . insulin aspart  0-15 Units Subcutaneous 6 times per day  . metoprolol  5 mg Intravenous Q6H  . pantoprazole (PROTONIX) IV  40 mg Intravenous Q24H  . silver sulfADIAZINE   Topical Daily    Continuous Infusions: . TPN (CLINIMIX) Adult without lytes 80 mL/hr at 01/24/14 1715   And  . fat emulsion 250 mL (01/24/14 1715)  . Marland KitchenTPN (CLINIMIX-E) Adult      Past Medical History  Diagnosis Date  . Diabetes mellitus without complication   . Tracheostomy dependence   . Dysphagia   . Clostridium difficile diarrhea   . Gout   . Hypertension   . CVA (cerebral infarction)   . CHF (congestive heart  failure)   . Anemia   . Depressed   . Neurogenic bladder   . Stroke   . STEMI (ST elevation myocardial infarction)     Past Surgical History  Procedure Laterality Date  . Peg placement    . Tracheostomy      Arthur Holms, RD, LDN Pager #: [phone] After-Hours Pager #: [phone]

## 2014-01-25 NOTE — Progress Notes (Signed)
I have seen and examined the patient and agree with the assessment and plans. Continuing wound care and will hold tube feeds  Cort Dragoo A. Magnus Ivan  MD, FACS

## 2014-02-17 DEATH — deceased

## 2015-09-12 IMAGING — CR DG CHEST 1V PORT
1 series · 1 of 1 positions shown · non-contrast
Comparison: None.

CLINICAL DATA: Central line placement

EXAM:
PORTABLE CHEST - 1 VIEW

[AP]
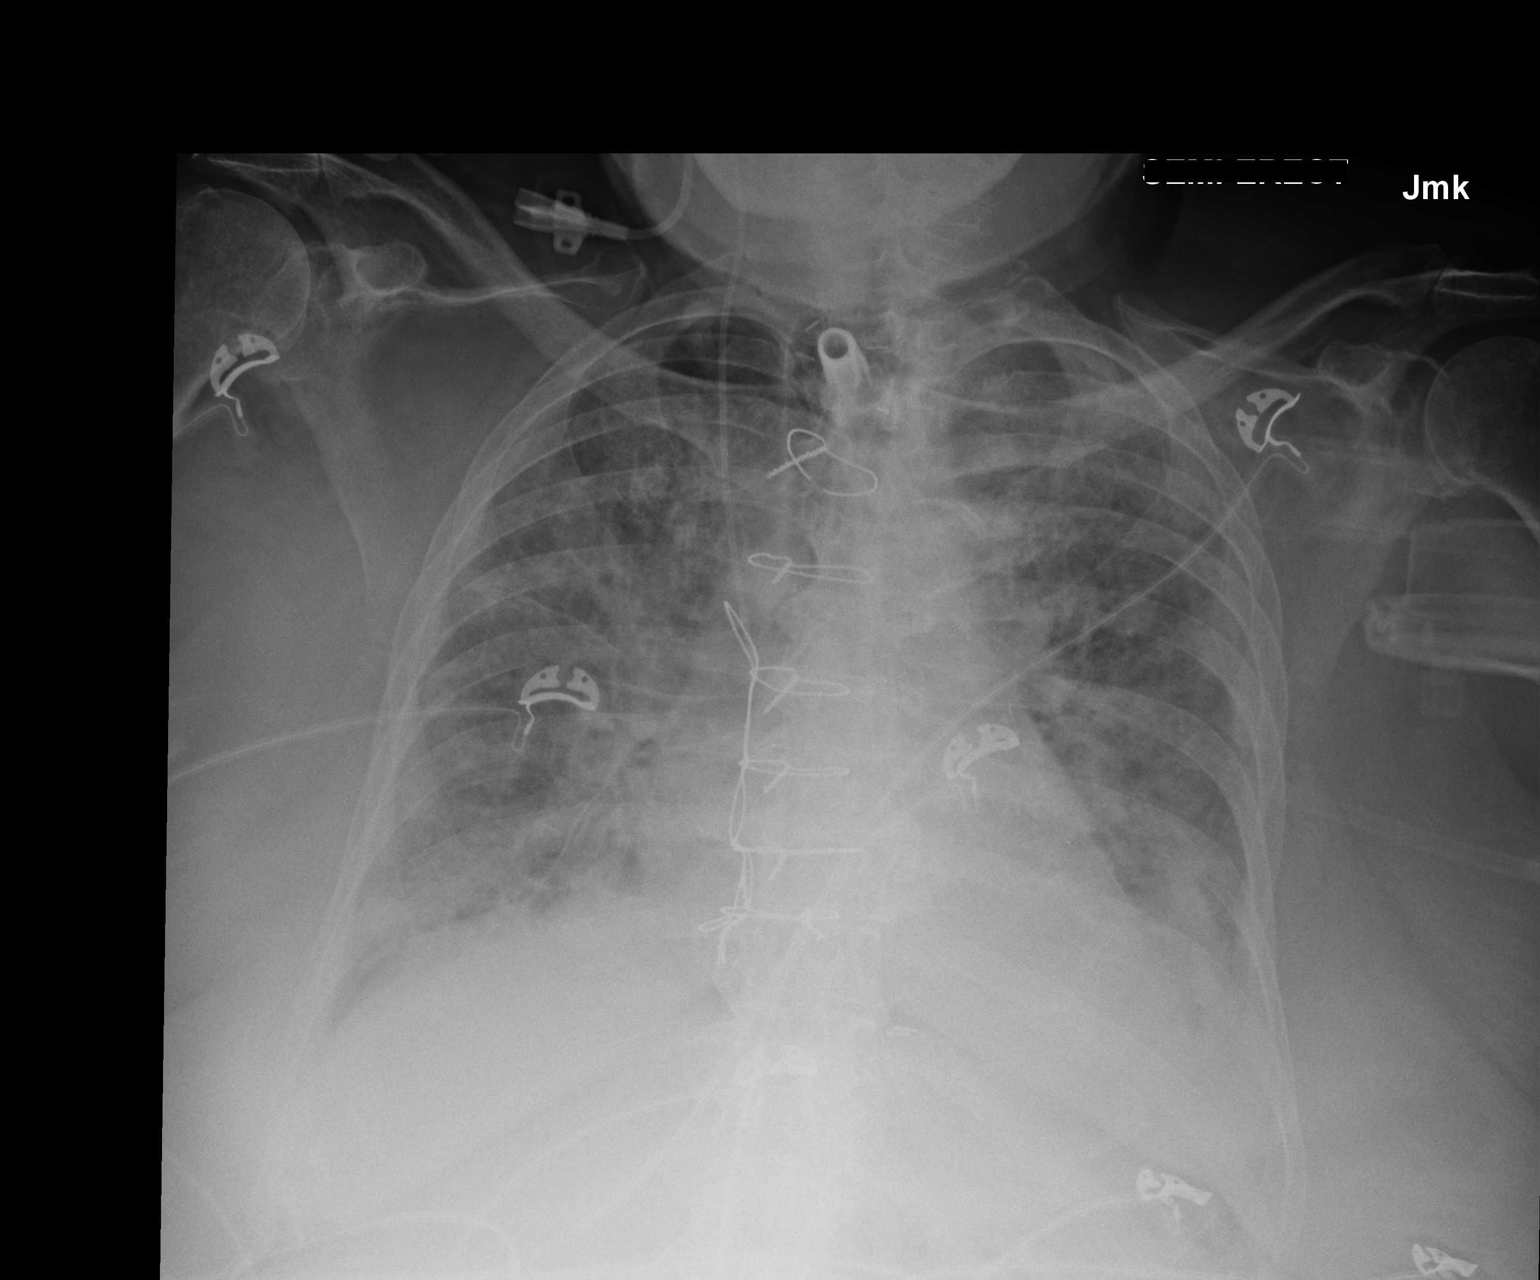

[1 of 1 positions shown; findings below may reference images not displayed]

FINDINGS: Right internal jugular central line tip identified projecting over
superior vena cava at the level of the azygos arch, approximately
1.5 cm above the cavoatrial junction. No pneumothorax. Tracheostomy
tube projects over the tracheal air column.

Heart size normal. There is extensive bilateral central hazy
opacity. There is calcification of the aortic arch.
IMPRESSION: Central line as described. Extensive bilateral airspace disease
possibly representing pulmonary edema.

## 2015-09-13 IMAGING — CT CT ABDOMEN W/O CM
2 of 4 series · 14 of 46 positions shown, 16 images · non-contrast
Comparison: None.

CLINICAL DATA: Evaluate PEG tube position abdominal wall.

EXAM:
CT ABDOMEN WITHOUT CONTRAST
TECHNIQUE: Multidetector CT imaging of the abdomen was performed following the
standard protocol without IV contrast.

[Series 2: abdomen 5.0 i30f 1 · axial · 0.86mm/px · z∈[+1155,+1405]mm · 11 of 56 slices shown, 13 images]
[im 3/56  soft-tissue]
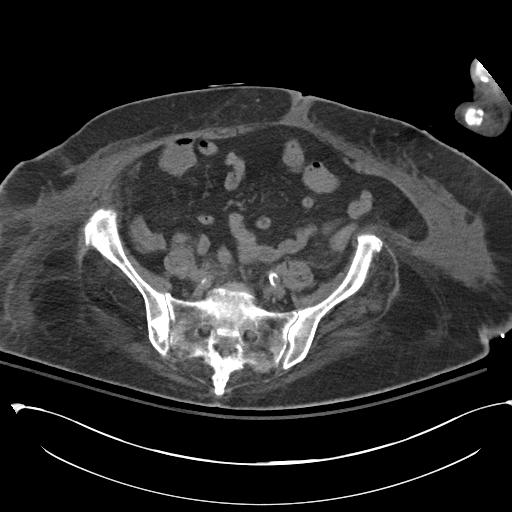
[im 3/56  bone]
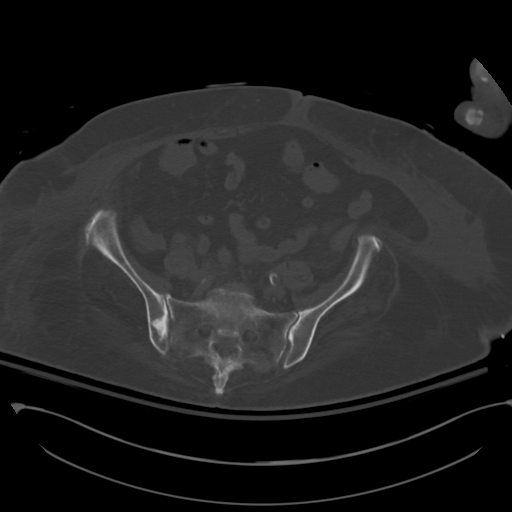
[im 8/56  soft-tissue]
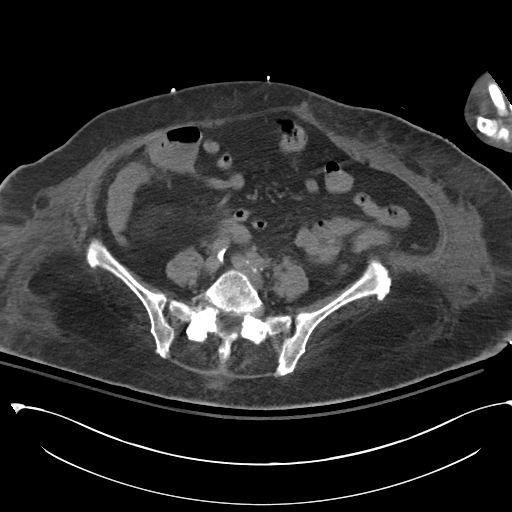
[im 14/56  soft-tissue]
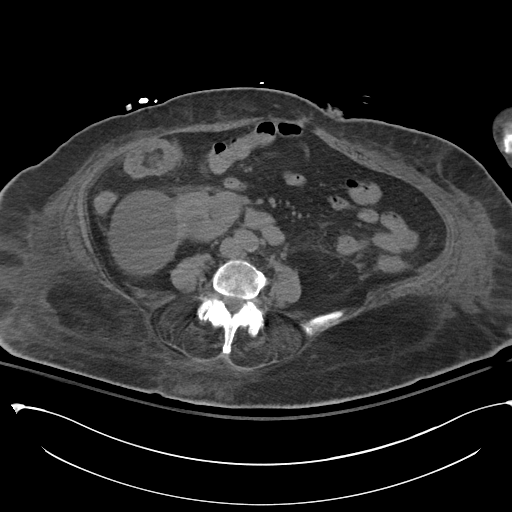
[im 19/56  soft-tissue]
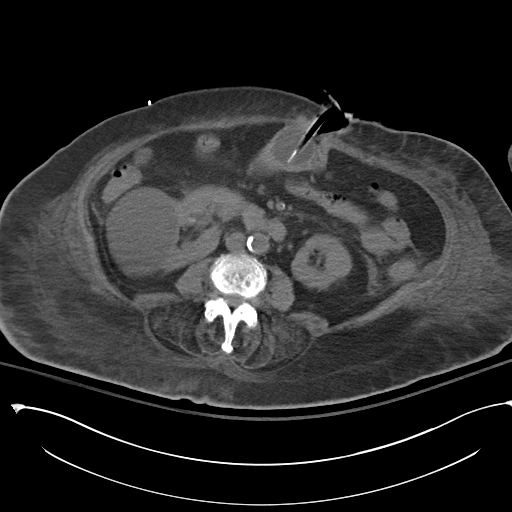
[im 24/56  soft-tissue]
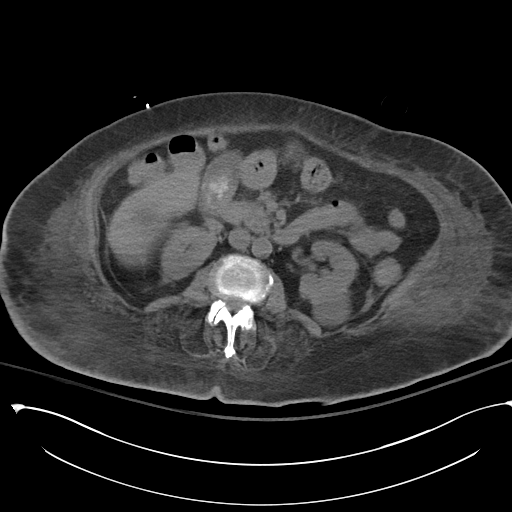
[im 29/56  soft-tissue]
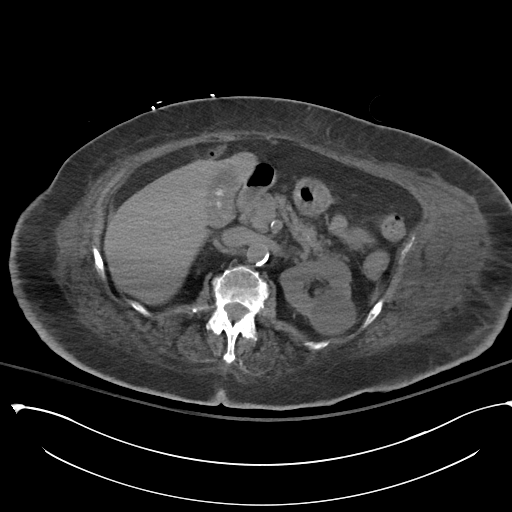
[im 32/56  soft-tissue]
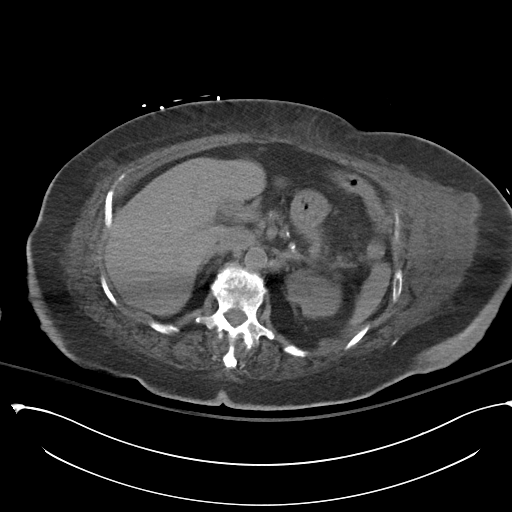
[im 37/56  soft-tissue]
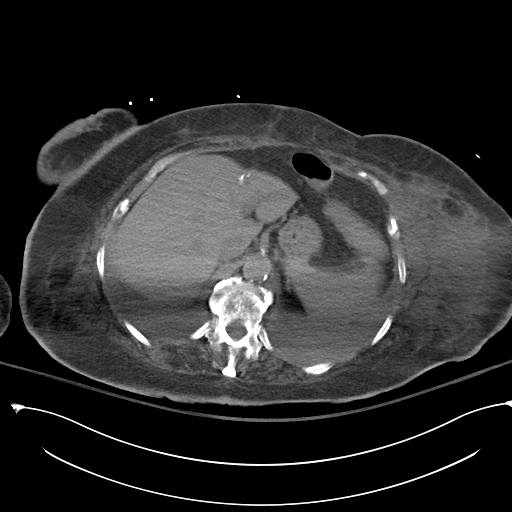
[im 42/56  soft-tissue]
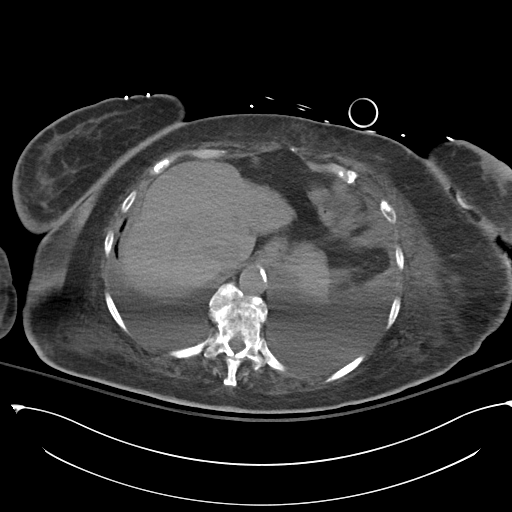
[im 42/56  bone]
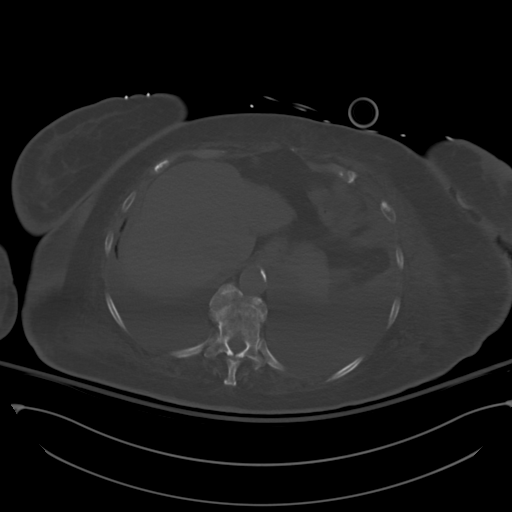
[im 48/56  soft-tissue]
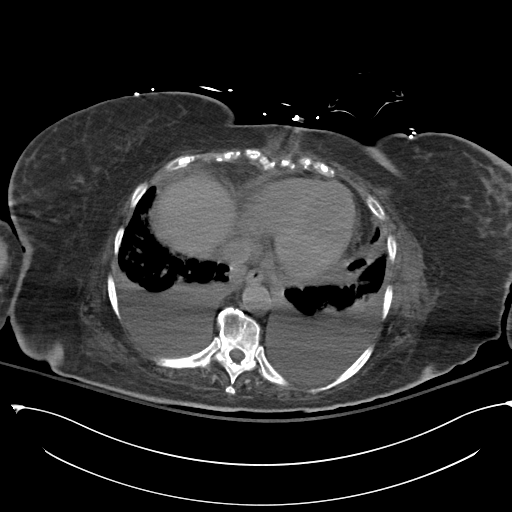
[im 53/56  soft-tissue]
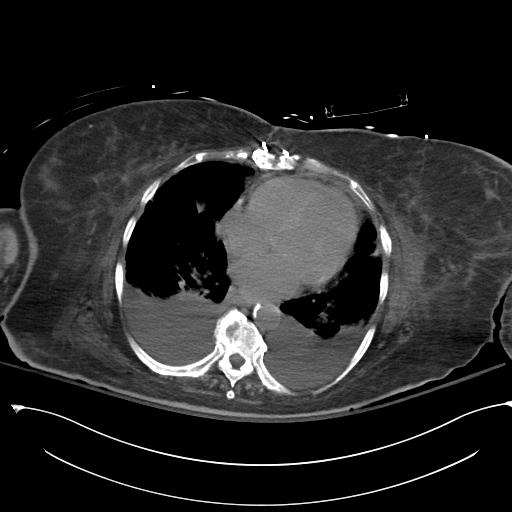

[Series 7: cor · coronal · 0.54mm/px · 3 of 147 slices shown]
[im 65/147  soft-tissue]
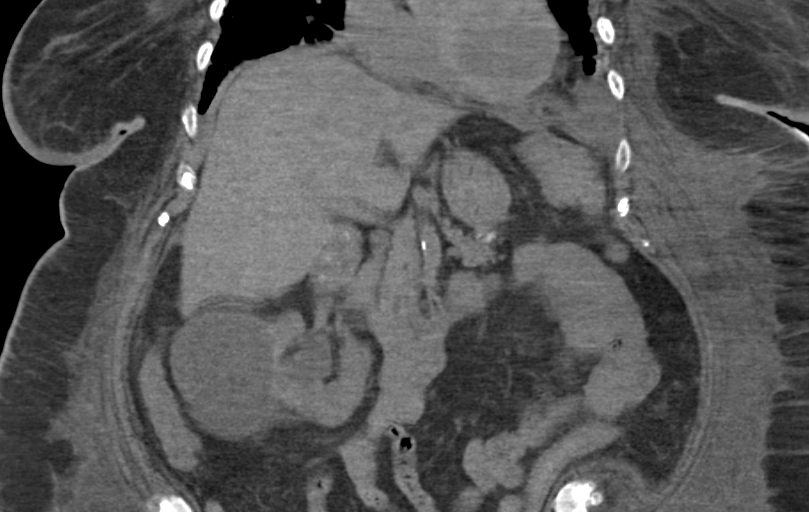
[im 82/147  soft-tissue]
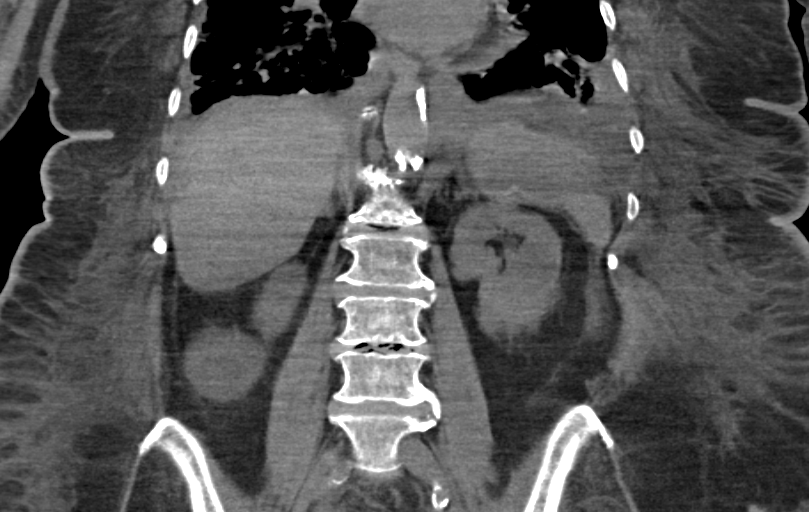
[im 98/147  soft-tissue]
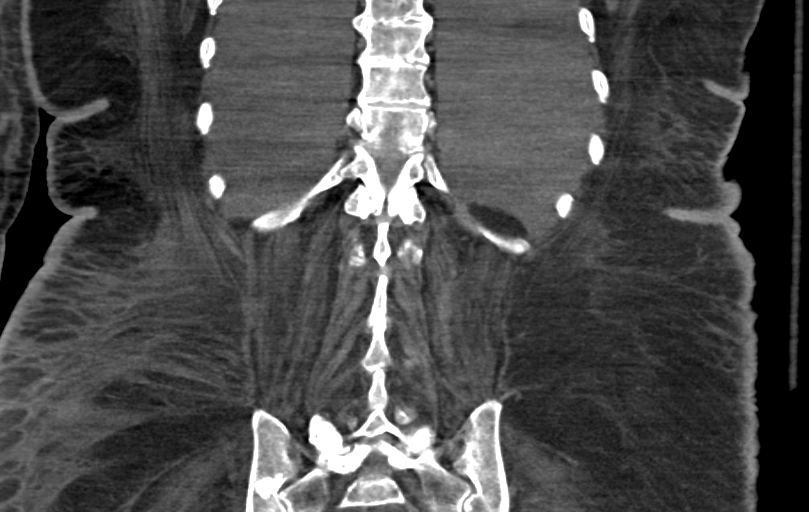

[14 of 46 positions shown; findings below may reference images not displayed]

FINDINGS: There are moderate bilateral pleural effusions, incompletely
visualized. Patchy opacities are partially visualized in the
bilateral lower lobes, right middle lobe, and lingula, with
evaluation partially limited by respiratory motion artifact.
Coronary artery calcification is partially visualized. Mildly
decreased attenuation of the blood pool is suggestive anemia.

There is a 1.2 cm hypodensity in the left hepatic lobe with
associated calcification. Multiple calcified gallstones are present.
No biliary dilatation is identified. Calcification at the medial
aspect of the spleen is favored to be vascular. The adrenal glands
and pancreas are unremarkable. Right lower pole renal cyst measures
8.4 cm. Right renal parapelvic cyst measures 2.5 cm. 3 mm
nonobstructing calculus is present in the upper pole of the left
kidney. 2 mm nonobstructing stone versus vascular calcification is
present in the left lower pole. Left renal interpolar cyst measures
4.3 cm.

A percutaneous gastrostomy tube is present. The balloon is inflated,
with the tube in the distal gastric body. The stomach is apposed to
the anterior abdominal wall at the gastrostomy site. There is
evidence of skin/subcutaneous soft tissue ulceration at the
gastrostomy site. No fluid collection is seen in this area. The
visualized small and large bowel are nondilated.

No intraperitoneal free fluid or free air is seen in the abdomen. No
enlarged lymph nodes are identified. Moderate atherosclerotic
calcification is noted of the abdominal aorta and its major branch
vessels. There is moderate, diffuse body wall edema. Sclerosis is
noted in the right ilium adjacent to the sacroiliac joint.
Multilevel thoracolumbar disc degeneration and lower lumbar facet
arthrosis are noted.
IMPRESSION: 1. Gastrostomy tube in place in the distal stomach. Superficial soft
tissue ulceration at the gastrostomy site. No fluid collection.
2. Cholelithiasis.
3. Anasarca and moderate bilateral pleural effusions. Partially
visualized lung opacities, which could reflect edema or multifocal
infection.
4. Small, partially calcified low-density liver lesion. This is
nonspecific and favored to be benign, such as a cyst related to
prior infection or hemangioma, although a metastatic or primary
malignant lesion cannot be completely excluded. Further evaluation
with contrast-enhanced abdominal MRI could be performed on an
elective basis as clinically warranted based on patient's other
comorbidities.

## 2015-09-19 IMAGING — CR DG CHEST 1V PORT SAME DAY
1 series · 1 of 1 positions shown · non-contrast
Comparison: 01/18/2014.

CLINICAL DATA: Increased work of breathing, tracheostomy.

EXAM:
PORTABLE CHEST - 1 VIEW SAME DAY

[AP]
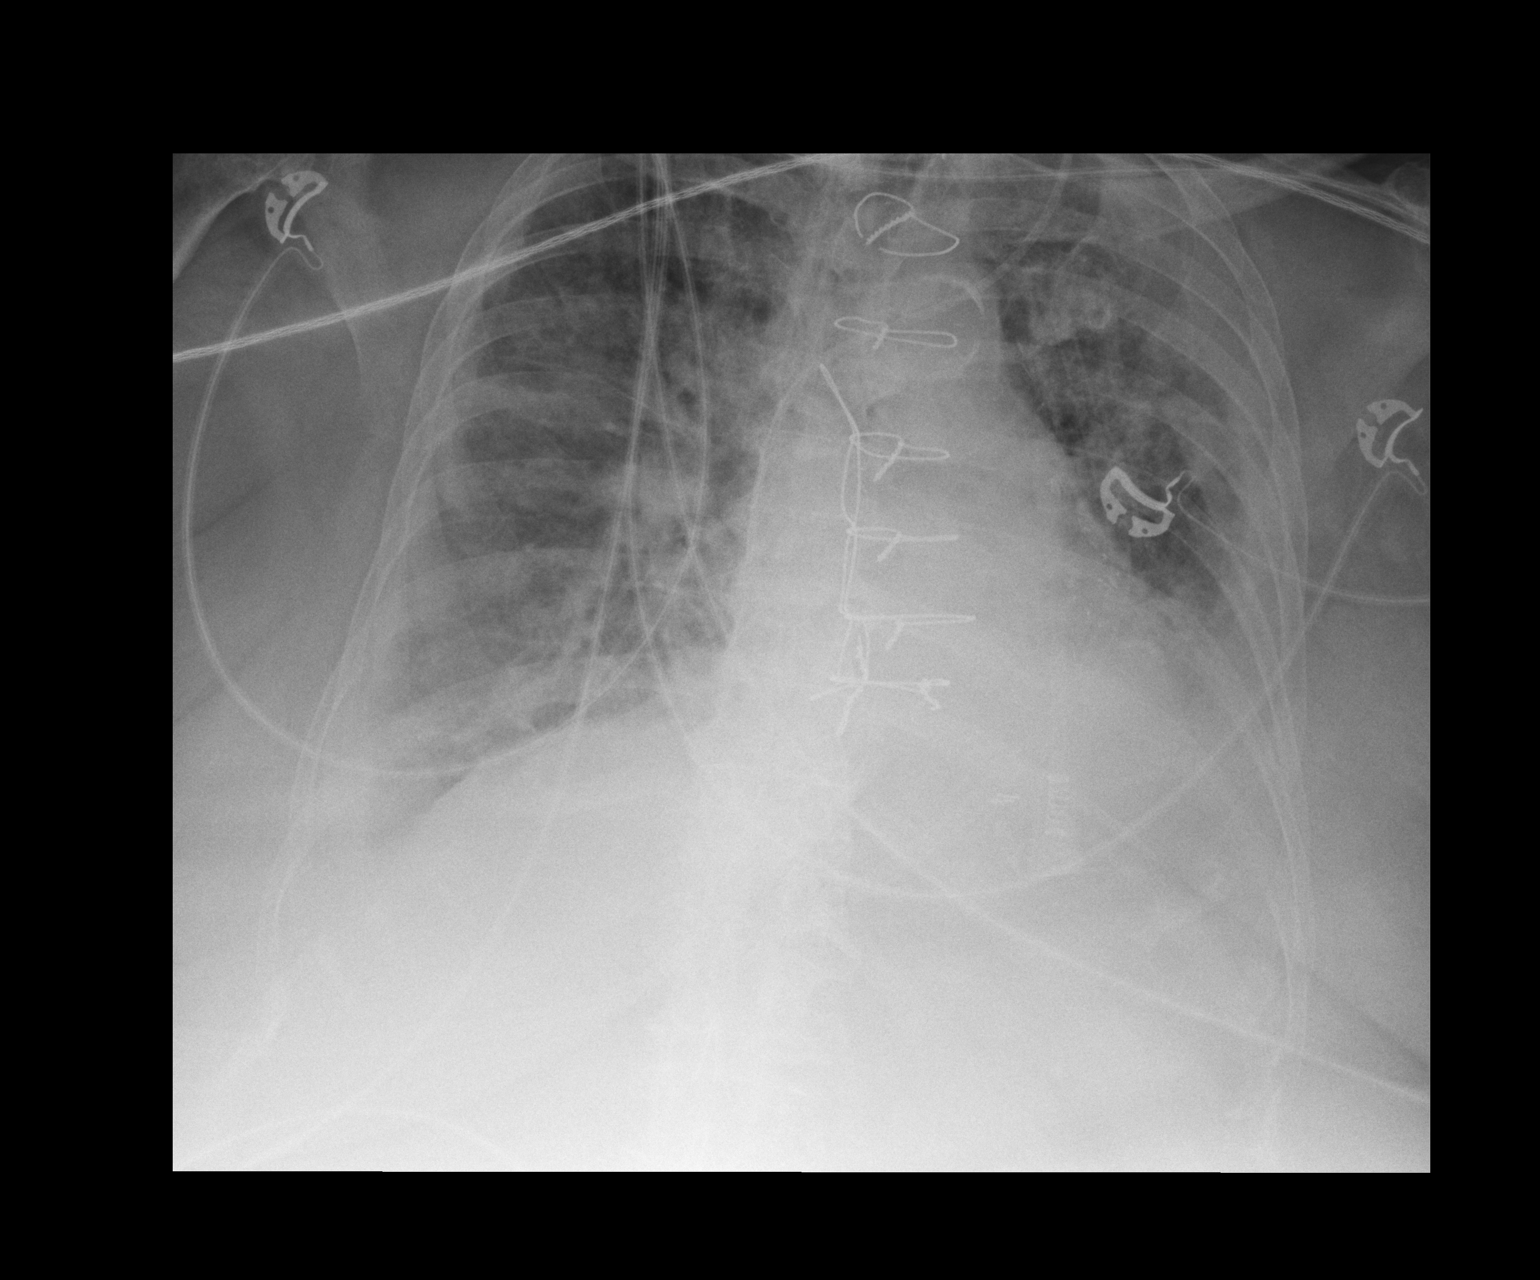

[1 of 1 positions shown; findings below may reference images not displayed]

FINDINGS: Tracheostomy is midline. Heart size stable. Thoracic aorta is
calcified. Left IJ central line tip projects over the low SVC. There
is patchy bilateral airspace disease, similar to the prior exam.
Probable small bilateral pleural effusions.
IMPRESSION: Diffuse patchy bilateral airspace disease likely represents edema,
stable. Small bilateral pleural effusions.

## 2015-09-20 IMAGING — CR DG CHEST 1V PORT
1 series · 1 of 1 positions shown · non-contrast
Comparison: Portable exam 3033 hr compared to 01/24/2014

CLINICAL DATA: Upper airway wheezing, tracheostomy, increased
respirations, assess atelectasis, history diabetes, hypertension,
stroke, CHF

EXAM:
PORTABLE CHEST - 1 VIEW

[portable]
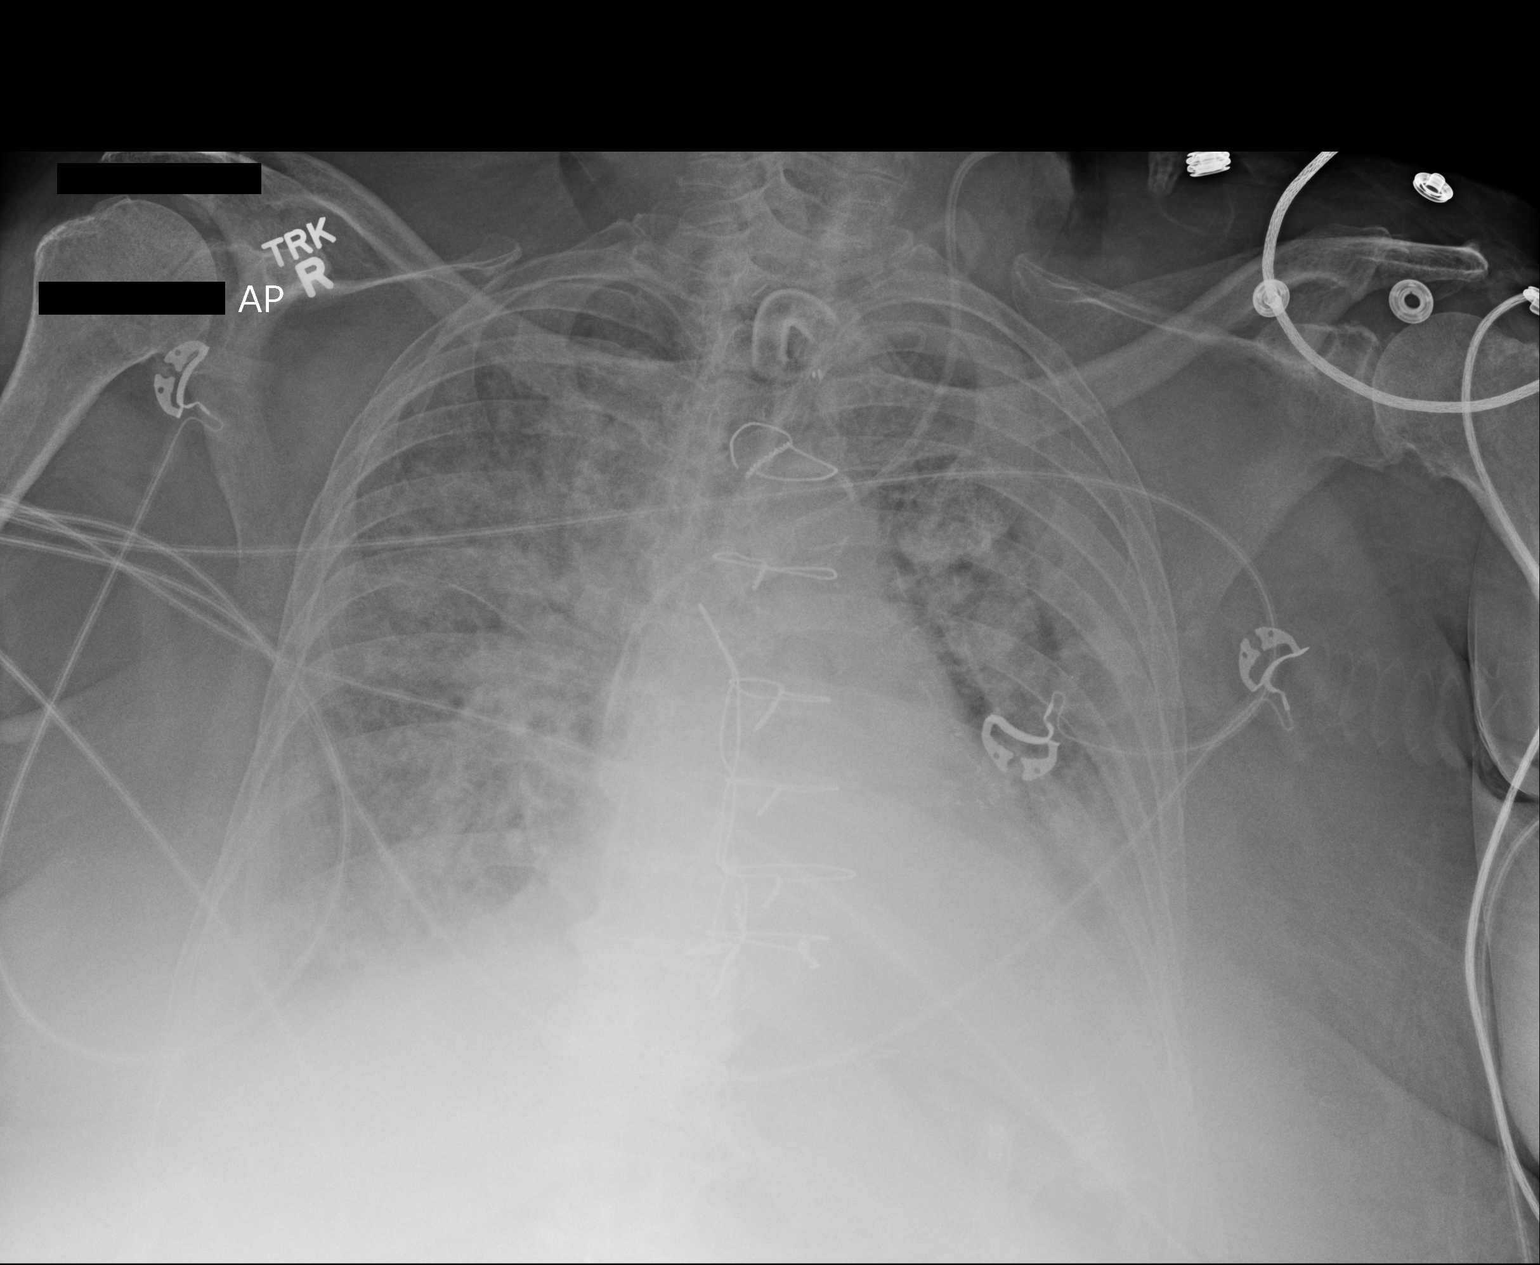

[1 of 1 positions shown; findings below may reference images not displayed]

FINDINGS: LEFT jugular line stable tip projecting over SVC.

Tracheostomy tube projects over tracheal air column.

Normal heart size post median sternotomy.

Atherosclerotic calcification aorta.

Severe diffuse BILATERAL pulmonary infiltrates, slightly increased,
question edema or infection. Questionable small bibasilar effusions.

No pneumothorax or acute osseous findings.
IMPRESSION: Increased BILATERAL pulmonary infiltrates, question edema versus
infection.
